# Patient Record
Sex: Female | Born: 1980 | Race: Black or African American | Hispanic: No | Marital: Single | Smoking: Never smoker
Health system: Southern US, Community
[De-identification: ages and names within clinical notes are randomized; demographics above are authoritative.]

## PROBLEM LIST (undated history)

## (undated) DIAGNOSIS — R87619 Unspecified abnormal cytological findings in specimens from cervix uteri: Secondary | ICD-10-CM

## (undated) DIAGNOSIS — I82409 Acute embolism and thrombosis of unspecified deep veins of unspecified lower extremity: Secondary | ICD-10-CM

## (undated) DIAGNOSIS — A64 Unspecified sexually transmitted disease: Secondary | ICD-10-CM

## (undated) HISTORY — DX: Acute embolism and thrombosis of unspecified deep veins of unspecified lower extremity: I82.409

## (undated) HISTORY — PX: TUBAL LIGATION: SHX77

## (undated) HISTORY — DX: Unspecified sexually transmitted disease: A64

## (undated) HISTORY — DX: Unspecified abnormal cytological findings in specimens from cervix uteri: R87.619

---

## 2016-10-14 ENCOUNTER — Emergency Department (HOSPITAL_COMMUNITY): Payer: No Typology Code available for payment source

## 2016-10-14 ENCOUNTER — Emergency Department (HOSPITAL_COMMUNITY)
Admission: EM | Admit: 2016-10-14 | Discharge: 2016-10-14 | Disposition: A | Discharge status: Home / Self Care | Payer: No Typology Code available for payment source | Attending: Emergency Medicine | Admitting: Emergency Medicine

## 2016-10-14 ENCOUNTER — Encounter (HOSPITAL_COMMUNITY): Payer: Self-pay | Admitting: *Deleted

## 2016-10-14 DIAGNOSIS — Y999 Unspecified external cause status: Secondary | ICD-10-CM | POA: Insufficient documentation

## 2016-10-14 DIAGNOSIS — M545 Low back pain: Secondary | ICD-10-CM | POA: Insufficient documentation

## 2016-10-14 DIAGNOSIS — M7918 Myalgia, other site: Secondary | ICD-10-CM

## 2016-10-14 DIAGNOSIS — Y92410 Unspecified street and highway as the place of occurrence of the external cause: Secondary | ICD-10-CM | POA: Diagnosis not present

## 2016-10-14 DIAGNOSIS — Y939 Activity, unspecified: Secondary | ICD-10-CM | POA: Insufficient documentation

## 2016-10-14 LAB — I-STAT BETA HCG BLOOD, ED (MC, WL, AP ONLY)

## 2016-10-14 MED ORDER — METHOCARBAMOL 500 MG PO TABS: 500.0000 mg | ORAL_TABLET | Freq: Two times a day (BID) | ORAL | 0 refills | Status: DC

## 2016-10-14 MED ORDER — NAPROXEN 500 MG PO TABS: 500.0000 mg | ORAL_TABLET | Freq: Two times a day (BID) | ORAL | 0 refills | Status: DC

## 2016-10-14 NOTE — ED Notes (Signed)
Assessment: Pain in R flank area. No radiation, full movement of lower extremity, no blunt trauma
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
## 2016-10-14 NOTE — Discharge Instructions (Signed)
Begin taking naproxen and Robaxin twice daily for pain. Follow up with PCP if symptoms do not improve in 2-3 days. Return to ED for worsening pain, trouble walking, numbness, weakness, trouble breathing or additional injury.

## 2016-10-14 NOTE — ED Notes (Signed)
Patient transported to X-ray 

## 2016-10-14 NOTE — ED Notes (Signed)
Papers reviewed with patient and she verbalizes understanding. Leaving ambulatory
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
## 2017-03-02 ENCOUNTER — Ambulatory Visit (INDEPENDENT_AMBULATORY_CARE_PROVIDER_SITE_OTHER): Payer: Managed Care, Other (non HMO) | Admitting: Obstetrics & Gynecology

## 2017-03-02 ENCOUNTER — Encounter: Payer: Self-pay | Admitting: Obstetrics & Gynecology

## 2017-03-02 ENCOUNTER — Other Ambulatory Visit: Payer: Self-pay | Admitting: Obstetrics & Gynecology

## 2017-03-02 VITALS — BP 102/70 | HR 66 | Resp 12 | Ht 67.0 in | Wt 139.5 lb

## 2017-03-02 DIAGNOSIS — I82409 Acute embolism and thrombosis of unspecified deep veins of unspecified lower extremity: Secondary | ICD-10-CM

## 2017-03-02 DIAGNOSIS — Z0000 Encounter for general adult medical examination without abnormal findings: Secondary | ICD-10-CM

## 2017-03-02 DIAGNOSIS — Z01419 Encounter for gynecological examination (general) (routine) without abnormal findings: Secondary | ICD-10-CM

## 2017-03-02 DIAGNOSIS — Z202 Contact with and (suspected) exposure to infections with a predominantly sexual mode of transmission: Secondary | ICD-10-CM

## 2017-03-02 DIAGNOSIS — O2230 Deep phlebothrombosis in pregnancy, unspecified trimester: Secondary | ICD-10-CM | POA: Diagnosis not present

## 2017-03-02 DIAGNOSIS — Z124 Encounter for screening for malignant neoplasm of cervix: Secondary | ICD-10-CM

## 2017-03-02 DIAGNOSIS — B009 Herpesviral infection, unspecified: Secondary | ICD-10-CM | POA: Insufficient documentation

## 2017-03-02 NOTE — Progress Notes (Signed)
36 y.o. 604P4 Single African American F here for annual exam.  Moved to [place] about two years ago.    Patient reports cycles are very irregular.  She will have bleeding for up to two weeks and then stop for a few days or up to several week.  Bleeding then restarts.  There doesn't seem to be any regularity to this.    Used depo provera in the past.  Had amenorrhea with Depo Provera.  H/O HSV.  Had outbreak at time of delivery with second child.  Has cesarean section for this and then had two scheduled cesarean sections     Patient's last menstrual period was 02/21/2017.          Sexually active: Yes.    The current method of family planning is tubal ligation.    Exercising: No.  The patient does not participate in regular exercise at present. Smoker:  no  Health Maintenance: Pap:  [facility] History of abnormal Pap:  no MMG:  never Colonoscopy:  never BMD:   never TDaP:  Unsure  Pneumonia vaccine(s):  never Zostavax:   never Hep C testing: not indicated Screening Labs: discuss with provider, Hb today: same   reports that she has never smoked. She has never used smokeless tobacco. She reports that she drinks alcohol. She reports that she does not use drugs.  Past Medical History:  Diagnosis Date  . DVT (deep venous thrombosis) (HCC)    in left leg per patient  . STD (sexually transmitted disease)    Genital Herpes    Past Surgical History:  Procedure Laterality Date  . CESAREAN SECTION    . TUBAL LIGATION      Current Outpatient Prescriptions  Medication Sig Dispense Refill  . Multiple Vitamins-Calcium (ONE-A-DAY WOMENS PO) Take 2 tablets by mouth daily.     No current facility-administered medications for this visit.     Family History  Problem Relation Age of Onset  . Colon cancer Mother   . Diabetes Maternal Grandmother     ROS:  Pertinent items are noted in HPI.  Otherwise, a comprehensive ROS was negative.  Exam:   BP 102/70 (BP  Location: Right Arm, Patient Position: Sitting, Cuff Size: Normal)   Pulse 66   Resp 12   Ht 5\' 7"  (1.702 m)   Wt 139 lb 8 oz (63.3 kg)   LMP 02/21/2017   BMI 21.85 kg/m     Height: 5\' 7"  (170.2 cm)  Ht Readings from Last 3 Encounters:  03/02/17 5\' 7"  (1.702 m)  10/14/16 5\' 8"  (1.727 m)    General appearance: alert, cooperative and appears stated age Head: Normocephalic, without obvious abnormality, atraumatic Neck: no adenopathy, supple, symmetrical, trachea midline and thyroid normal to inspection and palpation Lungs: clear to auscultation bilaterally Breasts: normal appearance, no masses or tenderness Heart: regular rate and rhythm Abdomen: soft, non-tender; bowel sounds normal; no masses,  no organomegaly Extremities: extremities normal, atraumatic, no cyanosis or edema Skin: Skin color, texture, turgor normal. No rashes or lesions Lymph nodes: Cervical, supraclavicular, and axillary nodes normal. No abnormal inguinal nodes palpated Neurologic: Grossly normal   Pelvic: External genitalia:  no lesions              Urethra:  normal appearing urethra with no masses, tenderness or lesions              Bartholins and Skenes: normal  Vagina: normal appearing vagina with normal color and discharge, no lesions              Cervix: no lesions              Pap taken: Yes.   Bimanual Exam:  Uterus:  enlarged, 10-12, no distinct fibroids noted weeks size              Adnexa: normal adnexa and no mass, fullness, tenderness               Rectovaginal: Confirms               Anus:  normal sphincter tone, no lesions  Chaperone was present for exam.  A:  Well Woman with normal exam H/O HSV H/O DVT with third pregnancy, was on anticoagulation with third and fourth pregnancy DUB  P:   Mammogram guidelines reviewed.   pap smear and HR HPV obtained today No rx for valtrex needed.  Pt will call if needs RF.   Return for PUS and further assessment of irregular  bleeding CBC, CMP, TSH obtained today Hep B, RPR, HIV, GC/Chl obtained today return annually or prn

## 2017-03-02 NOTE — Progress Notes (Signed)
Scheduled patient while in office for PUS on 03/09/2017 at 3 pm with 3:30 pm consult with Dr.Miller. Patient is agreeable to date and time.

## 2017-03-03 LAB — COMPREHENSIVE METABOLIC PANEL
ALBUMIN: 4.4 g/dL (ref 3.5–5.5)
ALK PHOS: 50 IU/L (ref 39–117)
ALT: 11 IU/L (ref 0–32)
AST: 20 IU/L (ref 0–40)
Albumin/Globulin Ratio: 1.4 (ref 1.2–2.2)
BILIRUBIN TOTAL: 0.3 mg/dL (ref 0.0–1.2)
BUN/Creatinine Ratio: 6 — ABNORMAL LOW (ref 9–23)
BUN: 6 mg/dL (ref 6–20)
CHLORIDE: 103 mmol/L (ref 96–106)
CO2: 22 mmol/L (ref 20–29)
CREATININE: 0.93 mg/dL (ref 0.57–1.00)
Calcium: 9.4 mg/dL (ref 8.7–10.2)
GFR calc Af Amer: 92 mL/min/{1.73_m2} (ref >59)
GFR calc non Af Amer: 80 mL/min/{1.73_m2} (ref >59)
GLUCOSE: 95 mg/dL (ref 65–99)
Globulin, Total: 3.1 g/dL (ref 1.5–4.5)
Potassium: 4.2 mmol/L (ref 3.5–5.2)
Sodium: 139 mmol/L (ref 134–144)
Total Protein: 7.5 g/dL (ref 6.0–8.5)

## 2017-03-03 LAB — HEP, RPR, HIV PANEL
HEP B S AG: NEGATIVE
HIV SCREEN 4TH GENERATION: NONREACTIVE
RPR: NONREACTIVE

## 2017-03-03 LAB — CBC
HEMOGLOBIN: 11 g/dL — AB (ref 11.1–15.9)
Hematocrit: 34.2 % (ref 34.0–46.6)
MCH: 26.6 pg (ref 26.6–33.0)
MCHC: 32.2 g/dL (ref 31.5–35.7)
MCV: 83 fL (ref 79–97)
PLATELETS: 268 10*3/uL (ref 150–379)
RBC: 4.14 x10E6/uL (ref 3.77–5.28)
RDW: 16.3 % — ABNORMAL HIGH (ref 12.3–15.4)
WBC: 4 10*3/uL (ref 3.4–10.8)

## 2017-03-03 LAB — TSH: TSH: 1.22 u[IU]/mL (ref 0.450–4.500)

## 2017-03-08 ENCOUNTER — Other Ambulatory Visit: Payer: Self-pay

## 2017-03-08 DIAGNOSIS — N926 Irregular menstruation, unspecified: Secondary | ICD-10-CM

## 2017-03-08 LAB — PAP LB, CT-NG NAA, HPV HIGH-RISK
Chlamydia, Nuc. Acid Amp: NEGATIVE
Gonococcus, Nuc. Acid Amp: NEGATIVE
HPV, high-risk: POSITIVE — AB
PAP SMEAR COMMENT: 0

## 2017-03-09 ENCOUNTER — Ambulatory Visit (INDEPENDENT_AMBULATORY_CARE_PROVIDER_SITE_OTHER): Payer: Managed Care, Other (non HMO)

## 2017-03-09 ENCOUNTER — Other Ambulatory Visit: Payer: Self-pay | Admitting: Obstetrics & Gynecology

## 2017-03-09 ENCOUNTER — Encounter: Payer: Self-pay | Admitting: Obstetrics & Gynecology

## 2017-03-09 ENCOUNTER — Ambulatory Visit (INDEPENDENT_AMBULATORY_CARE_PROVIDER_SITE_OTHER): Payer: Managed Care, Other (non HMO) | Admitting: Obstetrics & Gynecology

## 2017-03-09 VITALS — BP 110/76 | HR 68 | Resp 14 | Wt 139.0 lb

## 2017-03-09 DIAGNOSIS — D649 Anemia, unspecified: Secondary | ICD-10-CM

## 2017-03-09 DIAGNOSIS — R87811 Vaginal high risk human papillomavirus (HPV) DNA test positive: Secondary | ICD-10-CM

## 2017-03-09 DIAGNOSIS — R87619 Unspecified abnormal cytological findings in specimens from cervix uteri: Secondary | ICD-10-CM | POA: Diagnosis not present

## 2017-03-09 DIAGNOSIS — I82409 Acute embolism and thrombosis of unspecified deep veins of unspecified lower extremity: Secondary | ICD-10-CM

## 2017-03-09 DIAGNOSIS — N926 Irregular menstruation, unspecified: Secondary | ICD-10-CM

## 2017-03-09 DIAGNOSIS — R87620 Atypical squamous cells of undetermined significance on cytologic smear of vagina (ASC-US): Secondary | ICD-10-CM | POA: Diagnosis not present

## 2017-03-09 DIAGNOSIS — O2230 Deep phlebothrombosis in pregnancy, unspecified trimester: Secondary | ICD-10-CM

## 2017-03-09 DIAGNOSIS — N852 Hypertrophy of uterus: Secondary | ICD-10-CM | POA: Diagnosis not present

## 2017-03-09 NOTE — Progress Notes (Signed)
GYNECOLOGY  VISIT   HPI: 36 y.o. G47P4 Single African American female here for evaluation of DUB and enlarged uterus noted on initial exam performed 03/02/17.  Pt has been on Depo Provera in the past and had amenorrhea as a side effects.  She has seen had a BTL but would like to restart the Depo Provera due to amenorrhea side effects.  It has been several years since any gyn exam had been performed until 03/02/17.  At that exam, friability of the cervix was noted.  STD testing and Pap was obtained.  STD testing was negative.  Pap smear showed ASCUS and AGUS pap findings with +HR HPV testing noted.  Findings reviewed with pt.  Evaluation with colposcopy, ECC, endometrial biopsy recommended.  Additional treatment options discussed.  She continues to be interested in Depo Provera.  She would like to proceed with this today if possible.    Ultrasound findings from today: Uterus:  8.6 x 6.6 x 6.2cm.  No fibroids noted but c section scar seen. Endometrium 11.44mm, echogenic fluid noted within cavity Left ovary 2.8 x 1.4 x 1.4cm with 2.0 x 1.3cm left collapsed corpus luteal cyst. Right ovary:  2.9 x 2.0 x 1.8cm Cul de sac:  Moderate free fluid noted in cul de sac and free fluid around left ovary noted  GYNECOLOGIC HISTORY: Patient's last menstrual period was 02/21/2017. Contraception: BTL Menopausal hormone therapy: n/a  Patient Active Problem List   Diagnosis Date Noted  . DVT complicating pregnancy 03/02/2017  . HSV (herpes simplex virus) infection 03/02/2017    Past Medical History:  Diagnosis Date  . DVT (deep venous thrombosis) (HCC)    in left leg per patient  . STD (sexually transmitted disease)    Genital Herpes    Past Surgical History:  Procedure Laterality Date  . CESAREAN SECTION    . TUBAL LIGATION      MEDS:   Current Outpatient Prescriptions on File Prior to Visit  Medication Sig Dispense Refill  . Multiple Vitamins-Calcium (ONE-A-DAY WOMENS PO) Take 2 tablets by mouth  daily.     No current facility-administered medications on file prior to visit.     ALLERGIES: Patient has no known allergies.  Family History  Problem Relation Age of Onset  . Colon cancer Mother 103       stage IV, negative genetic testing  . Diabetes Maternal Grandmother     SH:  Single, non smoker  Review of Systems  Constitutional: Negative.   Respiratory: Negative.   Cardiovascular: Negative.   Gastrointestinal: Negative.   Genitourinary:       Irregular bleeding  Psychiatric/Behavioral: Negative.     PHYSICAL EXAMINATION:    BP 110/76 (BP Location: Left Arm, Patient Position: Sitting, Cuff Size: Normal)   Pulse 68   Resp 14   Wt 139 lb (63 kg)   LMP 02/21/2017   BMI 21.77 kg/m     Physical Exam  Constitutional: She is oriented to person, place, and time. She appears well-developed and well-nourished.  Genitourinary: Vagina normal. There is no rash, tenderness, lesion or injury on the right labia. There is no rash, tenderness, lesion or injury on the left labia.    Lymphadenopathy:       Right: No inguinal adenopathy present.       Left: No inguinal adenopathy present.  Neurological: She is alert and oriented to person, place, and time.  Skin: Skin is warm and dry.  Psychiatric: She has a normal mood and affect.  Colposcopy, ECC, and endometrial biopsy was performed.  Consent obtained.  Speculum placed.  Cervix visualized with colposcopy after application of AWE for >45 seconds.  Ectocervical trasntion zone noted with HPV changes at 5-7 o'clock and AWE at this location as well.  At 10-11 o'clock, there were punctations.  Biopsies obtained at 7 and 11 o'clock.  Then ECC obtained.  Cervix then cleansed with Betadine x 3 and endometrial pipelle passed to 8cm.  Good tissue sample obtained with one pass.  Monsel's applied to biopsy sites for excellent hemostasis.  Pt tolerated procedure well.  Instruments and speculum removed.  Chaperone was present for  exam.  Assessment: DUB Lapse of care Mild anemia H/O DVT so no estrogen products should be used ASCUS and AGUS pap smear with +HR HPV  Plan: Biopsies, ECC, endometrial biopsy results pending.  Results and recommendations will be called to pt.   ~15 minutes spent with patient >50% of time was in face to face discussion of ultrasound findings and options for treatment of DUB discussed before colposcopy with biopsies, ECC and endometrial biopsy.

## 2017-03-13 LAB — PATHOLOGY

## 2017-03-14 ENCOUNTER — Telehealth: Payer: Self-pay | Admitting: Obstetrics & Gynecology

## 2017-03-14 NOTE — Telephone Encounter (Signed)
Dr. Hyacinth MeekerMiller, please review pathology results dated 03/09/17 and advise?

## 2017-03-14 NOTE — Telephone Encounter (Signed)
Patient requesting results from biopsy. °

## 2017-03-15 MED ORDER — MEDROXYPROGESTERONE ACETATE 150 MG/ML IM SUSP: INTRAMUSCULAR | 3 refills | Status: DC

## 2017-03-15 NOTE — Telephone Encounter (Signed)
Spoke with patient, advised as seen below per Dr. Hyacinth MeekerMiller. Rx for depo-provera #1/3RF to verified pharmacy on file. Patient verbalizes understanding and is agreeable.  08 recall placed.  Patient is agreeable to disposition. Will close encounter.

## 2017-03-15 NOTE — Telephone Encounter (Signed)
Notes recorded by Leda MinHamm, Deondrea Markos N, RN on 03/15/2017 at 3:47 PM EDT Left message to call Noreene LarssonJill at [phone]. See telephone encounter dated 03/14/17. ------  Notes recorded by Jerene BearsMiller, Mary S, MD on 03/15/2017 at 3:42 PM EDT Please let pt know that her cervical biopsies showed CIN1, inflammation, and HPV changes. No cancer seen. Her ECC and endometrial biopsy were negative. Needs repeat pap and HR HPV 1 year. 08 recall.  Ok to start Depo Provera 150mg  every 11-13 weeks. Pt has self administered so ok to call into pharmacy for her for one year.

## 2017-03-15 NOTE — Telephone Encounter (Signed)
Left detailed message, ok per dpr. Advised patient message received about results, once results reviewed by Dr. Hyacinth MeekerMiller will return call to review. May return [facility] to [facility] at [phone] with any additional questions.

## 2017-03-15 NOTE — Telephone Encounter (Signed)
Routing results to you to call pt.  Thanks.

## 2017-06-08 NOTE — Addendum Note (Signed)
Addended by: Loreta AveMORALES, REINA C on: 06/08/2017 04:06 PM   Modules accepted: Orders

## 2018-04-17 ENCOUNTER — Telehealth: Payer: Self-pay

## 2018-04-17 NOTE — Telephone Encounter (Signed)
Attempted to contact patient but all numbers listed are not accepting incoming calls.

## 2018-06-08 ENCOUNTER — Other Ambulatory Visit (HOSPITAL_COMMUNITY)
Admission: RE | Admit: 2018-06-08 | Discharge: 2018-06-08 | Disposition: A | Discharge status: Home / Self Care | Payer: Managed Care, Other (non HMO) | Source: Ambulatory Visit | Attending: Obstetrics & Gynecology | Admitting: Obstetrics & Gynecology

## 2018-06-08 ENCOUNTER — Encounter: Payer: Self-pay | Admitting: Obstetrics & Gynecology

## 2018-06-08 ENCOUNTER — Other Ambulatory Visit: Payer: Self-pay

## 2018-06-08 ENCOUNTER — Ambulatory Visit (INDEPENDENT_AMBULATORY_CARE_PROVIDER_SITE_OTHER): Payer: Managed Care, Other (non HMO) | Admitting: Obstetrics & Gynecology

## 2018-06-08 VITALS — BP 110/80 | HR 68 | Resp 16 | Ht 66.75 in | Wt 151.0 lb

## 2018-06-08 DIAGNOSIS — R87619 Unspecified abnormal cytological findings in specimens from cervix uteri: Secondary | ICD-10-CM

## 2018-06-08 DIAGNOSIS — N938 Other specified abnormal uterine and vaginal bleeding: Secondary | ICD-10-CM | POA: Diagnosis not present

## 2018-06-08 DIAGNOSIS — Z124 Encounter for screening for malignant neoplasm of cervix: Secondary | ICD-10-CM | POA: Insufficient documentation

## 2018-06-08 DIAGNOSIS — R87610 Atypical squamous cells of undetermined significance on cytologic smear of cervix (ASC-US): Secondary | ICD-10-CM | POA: Diagnosis not present

## 2018-06-08 DIAGNOSIS — Z01419 Encounter for gynecological examination (general) (routine) without abnormal findings: Secondary | ICD-10-CM | POA: Diagnosis not present

## 2018-06-08 DIAGNOSIS — Z202 Contact with and (suspected) exposure to infections with a predominantly sexual mode of transmission: Secondary | ICD-10-CM

## 2018-06-08 MED ORDER — MEDROXYPROGESTERONE ACETATE 150 MG/ML IM SUSP: INTRAMUSCULAR | 3 refills | Status: DC

## 2018-06-08 MED ORDER — MEDROXYPROGESTERONE ACETATE 150 MG/ML IM SUSP
150.0000 mg | Freq: Once | INTRAMUSCULAR | Status: AC
  Administered 2018-06-08: 150 mg via INTRAMUSCULAR

## 2018-06-08 NOTE — Progress Notes (Signed)
37 y.o. G7P4 Single Black or [place] American female here for annual exam.  Was using Depo Provera this year.  Typically cycles some when on it.  Would like to restart this again.  Does need a paps mere today.  Desires STD testing today.  Patient's last menstrual period was 05/17/2018.          Sexually active: Yes.    The current method of family planning is tubal ligation.    Exercising: No.   Smoker:  no  Health Maintenance: Pap:  03/02/17 AGUS. HR HPV:+detected  History of abnormal Pap:  Yes, CIN 1 MMG:  Never TDaP:  Unsure Screening Labs: last year   reports that she has never smoked. She has never used smokeless tobacco. She reports that she drinks about 2.0 - 3.0 standard drinks of alcohol per week. She reports that she does not use drugs.  Past Medical History:  Diagnosis Date  . DVT (deep venous thrombosis) (HCC)    in left leg per patient  . STD (sexually transmitted disease)    Genital Herpes    Past Surgical History:  Procedure Laterality Date  . CESAREAN SECTION    . TUBAL LIGATION      Current Outpatient Medications  Medication Sig Dispense Refill  . medroxyPROGESTERone (DEPO-PROVERA) 150 MG/ML injection Inject 1 ml (150 mg total) into the muscle once every 11-13 weeks. 1 mL 3  . Multiple Vitamins-Calcium (ONE-A-DAY WOMENS PO) Take 2 tablets by mouth daily.     No current facility-administered medications for this visit.     Family History  Problem Relation Age of Onset  . Colon cancer Mother 43       stage IV, negative genetic testing  . Diabetes Maternal Grandmother     Review of Systems  All other systems reviewed and are negative.   Exam:   BP 110/80 (BP Location: Right Arm, Patient Position: Sitting, Cuff Size: Normal)   Pulse 68   Resp 16   Ht 5' 6.75" (1.695 m)   Wt 151 lb (68.5 kg)   LMP 05/17/2018   BMI 23.83 kg/m    Height: 5' 6.75" (169.5 cm)  Ht Readings from Last 3 Encounters:  06/08/18 5' 6.75" (1.695 m)  03/02/17 5\' 7"  (1.702 m)   10/14/16 5\' 8"  (1.727 m)    General appearance: alert, cooperative and appears stated age Head: Normocephalic, without obvious abnormality, atraumatic Neck: no adenopathy, supple, symmetrical, trachea midline and thyroid normal to inspection and palpation Lungs: clear to auscultation bilaterally Breasts: normal appearance, no masses or tenderness Heart: regular rate and rhythm Abdomen: soft, non-tender; bowel sounds normal; no masses,  no organomegaly Extremities: extremities normal, atraumatic, no cyanosis or edema Skin: Skin color, texture, turgor normal. No rashes or lesions Lymph nodes: Cervical, supraclavicular, and axillary nodes normal. No abnormal inguinal nodes palpated Neurologic: Grossly normal   Pelvic: External genitalia:  no lesions              Urethra:  normal appearing urethra with no masses, tenderness or lesions              Bartholins and Skenes: normal                 Vagina: normal appearing vagina with normal color and discharge, no lesions              Cervix: no lesions              Pap taken: Yes.   Bimanual  Exam:  Uterus:  normal size, contour, position, consistency, mobility, non-tender              Adnexa: normal adnexa and no mass, fullness, tenderness               Rectovaginal: Confirms               Anus:  normal sphincter tone, no lesions  Chaperone was present for exam.  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  Did have evaluation. DUB, on DUB Globular feeling uterus without fibroids on PUS 2018 Desires STD testing today Family hx of colon cancer  P:   Mammogram guidelines reviewed pap smear with HR HPV obtained today GC/Chl, trich, HIV, RPR, Hep B and C given RF for Depo provera 150mg  IM given x 1 today.  Rx to pharmacy.  Pt gives to self.  H/O BTL so timing does not need to be exact but  Depo calendar given to pt. Return annually or prn

## 2018-06-09 LAB — HEP, RPR, HIV PANEL
HEP B S AG: NEGATIVE
HIV Screen 4th Generation wRfx: NONREACTIVE
RPR Ser Ql: NONREACTIVE

## 2018-06-09 LAB — HEPATITIS C ANTIBODY: Hep C Virus Ab: <0.1 s/co ratio (ref 0.0–0.9)

## 2018-06-14 LAB — CYTOLOGY - PAP
CHLAMYDIA, DNA PROBE: NEGATIVE
HPV (WINDOPATH): DETECTED — AB
Neisseria Gonorrhea: NEGATIVE
TRICH (WINDOWPATH): NEGATIVE

## 2018-06-18 ENCOUNTER — Telehealth: Payer: Self-pay | Admitting: *Deleted

## 2018-06-18 DIAGNOSIS — R87619 Unspecified abnormal cytological findings in specimens from cervix uteri: Secondary | ICD-10-CM

## 2018-06-18 NOTE — Telephone Encounter (Signed)
Spoke with patient. Advised as seen below per Dr. Hyacinth MeekerMiller. Patient is familiar with colpo procedure. BTL for contraceptive. LMP 05/17/18. Received depo provera on 06/08/18, no menses to date. Patient requesting to schedule colpo in 07/2018. Advised I will review schedule with nursing supervisor and return call, patient agreeable.   Order placed for colposcopy.

## 2018-06-18 NOTE — Telephone Encounter (Signed)
Spoke with patient. Colpo scheduled for 07/19/18 at 9:45am with Dr. Hyacinth MeekerMiller. Advised to take Motrin 800 mg with food and water one hour before procedure. Advised patient order placed for precert, our business office will return call prior to appt to review benefits. Patient verbalizes understanding and is agreeable.  Routing to provider for final review. Patient is agreeable to disposition. Will close encounter.  Cc: Harland DingwallSuzy Dixon

## 2018-06-18 NOTE — Telephone Encounter (Signed)
Notes recorded by Leda MinHamm, Garritt Molyneux N, RN on 06/18/2018 at 2:45 PM EST Left message to call [facility]Jill, [facility] [phone].   Notes recorded by Jerene BearsMiller, Mary S, MD on 06/11/2018 at 12:17 AM EST Please let pt know her HIV, RPR, Hep B and C testing is negative. Pap, GC/Chl pending. Ok to hold for these results.

## 2018-06-18 NOTE — Telephone Encounter (Signed)
-----   Message from Jerene BearsMary S Miller, MD sent at 06/15/2018  8:11 AM EST ----- Please let pt know her pap was abnormal.  She does need a colposcopy with possible biopsies and ECC.  Thanks.

## 2018-07-05 ENCOUNTER — Telehealth: Payer: Self-pay | Admitting: Obstetrics & Gynecology

## 2018-07-05 NOTE — Telephone Encounter (Signed)
Patient is returning a call to Rosa. °

## 2018-07-05 NOTE — Telephone Encounter (Signed)
Call placed to convey benefits. 

## 2018-07-05 NOTE — Telephone Encounter (Signed)
Call placed to convey benefits for colpo/ecc.

## 2018-07-19 ENCOUNTER — Ambulatory Visit: Payer: Self-pay | Admitting: Obstetrics & Gynecology

## 2018-07-19 ENCOUNTER — Encounter: Payer: Self-pay | Admitting: Obstetrics & Gynecology

## 2018-07-19 ENCOUNTER — Telehealth: Payer: Self-pay | Admitting: Obstetrics & Gynecology

## 2018-07-19 NOTE — Telephone Encounter (Signed)
Patient called and stated that she was just waking up and would be over 30 minutes late to her appointment. Patient needs to reschedule Colpo & ECC.  Dr. Hyacinth Meeker- please advise on [facility] policy.

## 2018-07-19 NOTE — Telephone Encounter (Signed)
Left message to call Brittney Larsson, RN at [facility] [phone].    BTL for contraceptive Received Depo provera on 06/08/18

## 2018-07-19 NOTE — Telephone Encounter (Signed)
[facility] in [facility] situation.  Thanks.

## 2018-07-19 NOTE — Telephone Encounter (Signed)
Spoke with patient. Has light cycle since starting depo provera, LMP 07/14/18. Colpo rescheduled to 08/07/18 at 1:30pm with Dr. Hyacinth Meeker. Advised to take Motrin 800 mg with food and water one hour before procedure.  Routing to provider for final review. Patient is agreeable to disposition. Will close encounter.

## 2018-07-23 ENCOUNTER — Encounter: Payer: Self-pay | Admitting: Obstetrics & Gynecology

## 2018-08-01 ENCOUNTER — Emergency Department (HOSPITAL_BASED_OUTPATIENT_CLINIC_OR_DEPARTMENT_OTHER)
Admission: EM | Admit: 2018-08-01 | Discharge: 2018-08-02 | Disposition: A | Discharge status: Home / Self Care | Payer: Managed Care, Other (non HMO) | Attending: Emergency Medicine | Admitting: Emergency Medicine

## 2018-08-01 ENCOUNTER — Emergency Department (HOSPITAL_BASED_OUTPATIENT_CLINIC_OR_DEPARTMENT_OTHER): Payer: Managed Care, Other (non HMO)

## 2018-08-01 ENCOUNTER — Other Ambulatory Visit: Payer: Self-pay

## 2018-08-01 ENCOUNTER — Encounter (HOSPITAL_BASED_OUTPATIENT_CLINIC_OR_DEPARTMENT_OTHER): Payer: Self-pay

## 2018-08-01 DIAGNOSIS — R0981 Nasal congestion: Secondary | ICD-10-CM | POA: Diagnosis present

## 2018-08-01 DIAGNOSIS — Z79899 Other long term (current) drug therapy: Secondary | ICD-10-CM | POA: Diagnosis not present

## 2018-08-01 DIAGNOSIS — R0789 Other chest pain: Secondary | ICD-10-CM | POA: Insufficient documentation

## 2018-08-01 DIAGNOSIS — R69 Illness, unspecified: Secondary | ICD-10-CM

## 2018-08-01 DIAGNOSIS — R059 Cough, unspecified: Secondary | ICD-10-CM

## 2018-08-01 DIAGNOSIS — R05 Cough: Secondary | ICD-10-CM

## 2018-08-01 DIAGNOSIS — J111 Influenza due to unidentified influenza virus with other respiratory manifestations: Secondary | ICD-10-CM | POA: Insufficient documentation

## 2018-08-01 NOTE — ED Triage Notes (Addendum)
C/o flu like sx with DOE x 4 days-NAD-steady gait

## 2018-08-01 NOTE — ED Provider Notes (Signed)
[facility] EMERGENCY DEPARTMENT Provider Note  CSN: [id number] Arrival date & time: 08/01/18 2226  Chief Complaint(s) Cough  HPI Brittney Franco is a 38 y.o. 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  She presents today with 3 to 4 days of nasal congestion, rhinorrhea, postnasal drip, sore throat, cough, myalgias that gradually worsened since onset.  Patient also endorsed posttussive chest pain.  Denies any current pain at this time.  Also endorsed mild shortness of breath while climbing over these past couple of days.  She reports that she has been having chills and subjective fevers.  Endorses sick contacts at work with flulike symptoms.  Endorses nausea without emesis.  No abdominal pain or diarrhea.  HPI  Past Medical History Past Medical History:  Diagnosis Date  . DVT (deep venous thrombosis) (HCC)    in left leg per patient  . STD (sexually transmitted disease)    Genital Herpes   Patient Active Problem List   Diagnosis Date Noted  . DUB (dysfunctional uterine bleeding) 06/08/2018  . DVT complicating pregnancy 03/02/2017  . HSV (herpes simplex virus) infection 03/02/2017   Home Medication(s) Prior to Admission medications   Medication Sig Start Date End Date Taking? Authorizing Provider  medroxyPROGESTERone (DEPO-PROVERA) 150 MG/ML injection Inject 1 ml (150 mg total) into the muscle once every 11-13 weeks. 06/08/18   Jerene Bears, MD  Multiple Vitamins-Calcium (ONE-A-DAY WOMENS PO) Take 2 tablets by mouth daily.    [provider]                                                                                                                                    Past Surgical History Past Surgical History:  Procedure Laterality Date  . CESAREAN SECTION    . TUBAL LIGATION     Family History Family History  Problem Relation Age of Onset  . Colon cancer Mother 27       stage IV,  negative genetic testing  . Diabetes Maternal Grandmother     Social History Social History   Tobacco Use  . Smoking status: Never Smoker  . Smokeless tobacco: Never Used  Substance Use Topics  . Alcohol use: Yes    Comment: occ  . Drug use: No   Allergies Patient has no known allergies.  Review of Systems Review of Systems All other systems are reviewed and are negative for acute change except as noted in the HPI  Physical Exam Vital Signs  I have reviewed the triage vital signs BP 121/72 (BP Location: Left Arm)   Pulse 84   Temp 98.7 F (37.1 C) (Oral)   Resp 18   Ht 5\' 7"  (1.702 m)   Wt 68.5 kg   LMP 06/13/2018   SpO2 100%   BMI 23.65 kg/m   Physical Exam Vitals signs reviewed.  Constitutional:  General: She is not in acute distress.    Appearance: She is well-developed. She is not diaphoretic.  HENT:     Head: Normocephalic and atraumatic.     Right Ear: Tympanic membrane, ear canal and external ear normal. No mastoid tenderness.     Left Ear: Ear canal and external ear normal. A middle ear effusion is present. No mastoid tenderness.     Nose: Mucosal edema and rhinorrhea present.     Mouth/Throat:     Mouth: Mucous membranes are moist.     Dentition: No gum lesions.     Tongue: No lesions.     Palate: No lesions.     Pharynx: Posterior oropharyngeal erythema (mild with postnasal drip.) present. No pharyngeal swelling, oropharyngeal exudate or uvula swelling.     Tonsils: Swelling: 0 on the right. 0 on the left.  Eyes:     General: No scleral icterus.       Right eye: No discharge.        Left eye: No discharge.     Conjunctiva/sclera: Conjunctivae normal.     Pupils: Pupils are equal, round, and reactive to light.  Neck:     Musculoskeletal: Normal range of motion and neck supple.  Cardiovascular:     Rate and Rhythm: Normal rate and regular rhythm.     Heart sounds: No murmur. No friction rub. No gallop.   Pulmonary:     Effort: Pulmonary  effort is normal. No respiratory distress.     Breath sounds: Normal breath sounds. No stridor. No rales.  Abdominal:     General: There is no distension.     Palpations: Abdomen is soft.     Tenderness: There is no abdominal tenderness.  Musculoskeletal:        General: No tenderness.     Comments: No extremity swelling  Skin:    General: Skin is warm and dry.     Findings: No erythema or rash.  Neurological:     Mental Status: She is alert and oriented to person, place, and time.     ED Results and Treatments Labs (all labs ordered are listed, but only abnormal results are displayed) Labs Reviewed - No data to display                                                                                                                       EKG  EKG Interpretation  Date/Time:  Wednesday August 01 2018 23:55:08 EST Ventricular Rate:  66 PR Interval:    QRS Duration: 79 QT Interval:  411 QTC Calculation: 431 R Axis:   64 Text Interpretation:  Sinus rhythm no stemi No old tracing to compare Confirmed by Drema Pryardama, Pedro [phone]([id number]) on 08/02/2018 12:12:44 AM      Radiology Dg Chest 2 View  Result Date: 08/01/2018 CLINICAL DATA:  Chest pain, cough, shortness of breath EXAM: CHEST - 2 VIEW COMPARISON:  None. FINDINGS: Heart and mediastinal contours are within normal limits. No focal  opacities or effusions. No acute bony abnormality. IMPRESSION: No active cardiopulmonary disease. Electronically Signed   By: Charlett NoseKevin  Dover M.D.   On: 08/01/2018 23:54   Pertinent labs & imaging results that were available during my care of the patient were reviewed by me and considered in my medical decision making (see chart for details).  Medications Ordered in ED Medications - No data to display                                                                                                                                  Procedures Procedures  (including critical care time)  Medical Decision Making /  ED Course I have reviewed the nursing notes for this encounter and the patient's prior records (if available in EHR or on provided paperwork).    38 y.o. female presents with flu-like symptoms for 4 days.  adequate oral hydration. Rest of history as above.  Patient appears well. No signs of toxicity, patient is interactive. No hypoxia, tachypnea or other signs of respiratory distress. No sign of clinical dehydration. Lung exam clear. Rest of exam as above.  Patient has a history of DVT per her indolent process is not suspicious for pulmonary embolism.  Most consistent with flu-like illness.  Chest x-ray without evidence of pneumonia.  No evidence suggestive of pharyngitis, AOM, PNA.   Discussed symptomatic treatment with the patient and they will follow closely with their PCP.    Final Clinical Impression(s) / ED Diagnoses Final diagnoses:  Cough  Influenza-like illness   Disposition: Discharge  Condition: Good  I have discussed the results, Dx and Tx plan with the patient who expressed understanding and agree(s) with the plan. Discharge instructions discussed at great length. The patient was given strict return precautions who verbalized understanding of the instructions. No further questions at time of discharge.    ED Discharge Orders    None       Follow Up: Primary care provider  Schedule an appointment as soon as possible for a visit  As needed, If symptoms do not improve or  worsen in 5-7 days      This chart was dictated using voice recognition software.  Despite best efforts to proofread,  errors can occur which can change the documentation meaning.   Nira Connardama, Pedro Eduardo, MD 08/02/18 [phone]

## 2018-08-02 NOTE — Discharge Instructions (Addendum)
You may take over-the-counter medicine for symptomatic relief, such as Tylenol, Motrin, TheraFlu, Alka seltzer , black elderberry, etc. Please limit acetaminophen (Tylenol) to 4000 mg and Ibuprofen (Motrin, Advil, etc.) to 2400 mg for a 24hr period. Please note that other over-the-counter medicine may contain acetaminophen or ibuprofen as a component of their ingredients.   

## 2018-08-07 ENCOUNTER — Ambulatory Visit (INDEPENDENT_AMBULATORY_CARE_PROVIDER_SITE_OTHER): Payer: Managed Care, Other (non HMO) | Admitting: Obstetrics & Gynecology

## 2018-08-07 ENCOUNTER — Encounter: Payer: Self-pay | Admitting: Obstetrics & Gynecology

## 2018-08-07 VITALS — BP 130/70 | HR 66 | Resp 16 | Ht 66.0 in | Wt 146.6 lb

## 2018-08-07 DIAGNOSIS — R87610 Atypical squamous cells of undetermined significance on cytologic smear of cervix (ASC-US): Secondary | ICD-10-CM

## 2018-08-07 DIAGNOSIS — R87810 Cervical high risk human papillomavirus (HPV) DNA test positive: Secondary | ICD-10-CM

## 2018-08-07 DIAGNOSIS — R87619 Unspecified abnormal cytological findings in specimens from cervix uteri: Secondary | ICD-10-CM

## 2018-08-07 NOTE — Progress Notes (Signed)
38 y.o. Single African American female here for colposcopy with possible biopsies and/or ECC due to atypical endocervical cells with +HR HPV Pap obtained 06/08/2018.    Patient's last menstrual period was 06/16/2018 (exact date).          Sexually active: Yes.    The current method of family planning is tubal ligation.     Patient has been counseled about results and procedure.  Risks and benefits have bene reviewed including immediate and/or delayed bleeding, infection, cervical scaring from procedure, possibility of needing additional follow up as well as treatment.  rare risks of missing a lesion discussed as well.  All questions answered.  Pt ready to proceed.  BP 130/70   Pulse 66   Resp 16   Ht 5\' 6"  (1.676 m)   Wt 146 lb 9.6 oz (66.5 kg)   LMP 06/16/2018 (Exact Date)   BMI 23.66 kg/m   Physical Exam  Speculum placed.  3% acetic acid applied to cervix for >45 seconds.  Cervix visualized with both 7.5X and 15X magnification.  Green filter also used.  Lugols solution was used.  Findings:  AWE and decreased staining at 6 - 9 o'clock.  Vessel noted at 6 o'clock position as well.  Biopsy:  Both 6 and 9 o'clock positions.  ECC:  was performed.  Monsel's was needed. There was significant bleeding from the 6 o'clock position.  Longer time than normal needed to obtained excellent hemostasis.  But excellent hemostasis was achieved with Monsel's and pressure.  Pt tolerated procedure well and all instruments were removed.  Findings noted above on picture of cervix.  Assessment:  Atypical endocervical cells with +HR HPV  Plan:  Pathology results will be called to patient and follow-up planned pending results.

## 2018-08-16 ENCOUNTER — Telehealth: Payer: Self-pay | Admitting: Emergency Medicine

## 2018-08-16 DIAGNOSIS — R87619 Unspecified abnormal cytological findings in specimens from cervix uteri: Secondary | ICD-10-CM

## 2018-08-16 NOTE — Telephone Encounter (Signed)
-----   Message from Jerene Bears, MD sent at 08/15/2018  2:22 PM EST ----- Please let pt know that her biopsies showed some mild atypic but no actual dypslasia.  The ECC was negative as well.  As this was all negative, she does need an endometrial biopsy.  Please schedule appt.

## 2018-08-16 NOTE — Telephone Encounter (Signed)
Message left to return call to Tibbie at [phone].   Needs Endometrial biopsy order.  Hx BTL.

## 2018-08-16 NOTE — Telephone Encounter (Signed)
Patient returned call. Results reviewed with patient and she verbalized understanding. Patient asking cost of EMB prior to scheduling. RN advised would send message to [place] [employer] [place] in [employer] Department to reach out to patient to review benefits prior to scheduling. Patient agreeable.   Routing to [place] to assist patient.

## 2018-08-21 NOTE — Telephone Encounter (Signed)
Call placed to patient to review benefits for recommended endometrial biopsy. Left voicemail message requesting a return call    cc: Soundra Pilon

## 2018-08-29 NOTE — Telephone Encounter (Signed)
Call placed to patient to review benefits for recommended endometrial biopsy. Left voicemail message requesting a return call

## 2018-11-16 NOTE — Telephone Encounter (Signed)
Routing to [employer] for follow up with patient.

## 2018-11-19 ENCOUNTER — Telehealth: Payer: Self-pay | Admitting: Obstetrics & Gynecology

## 2018-11-19 NOTE — Telephone Encounter (Signed)
See previous phone notes. Call placed to patient to follow up and schedule endometrial biopsy. Left voicemail message requesting a return call.   cc: Nolen Mu, RN

## 2018-11-19 NOTE — Telephone Encounter (Signed)
Spoke with patient to schedule EMB. Patient states that she is not going to proceed with EMB at this time. Declines to schedule. Does not want to discuss benefits or scheduling at this time. Advised will notify Dr.Miller.

## 2018-12-31 ENCOUNTER — Encounter: Payer: Self-pay | Admitting: Obstetrics & Gynecology

## 2018-12-31 NOTE — Telephone Encounter (Signed)
Letter mailed to patient.

## 2018-12-31 NOTE — Telephone Encounter (Signed)
Letter written to be mailed to pt.  Pt is in a recall for 08/2019 Pap smear.

## 2019-06-24 ENCOUNTER — Telehealth: Payer: Self-pay | Admitting: Obstetrics & Gynecology

## 2019-06-24 DIAGNOSIS — R87619 Unspecified abnormal cytological findings in specimens from cervix uteri: Secondary | ICD-10-CM

## 2019-06-24 NOTE — Telephone Encounter (Signed)
Patient is calling to schedule endometrial biopsy. Updated insurance information sent to [facility] via staff message.

## 2019-06-24 NOTE — Telephone Encounter (Signed)
Left message to call back for EMB procedure.

## 2019-06-25 ENCOUNTER — Telehealth: Payer: Self-pay | Admitting: Obstetrics & Gynecology

## 2019-06-25 NOTE — Telephone Encounter (Signed)
Call placed to patient to review benefit for scheduled endometrial biopsy. Patient advised she is unable to talk. Patient  States she will call back.    copied to [facility]

## 2019-06-25 NOTE — Telephone Encounter (Signed)
Spoke to pt. Pt wanting to set up recommended EMB from 08/2018. Pt scheduled for AEX on 07/16/2019. Pt wanting to wait on EMB until late Jan 2021 for start of new insurance. Marland Kitchen EMB scheduled for 07/29/2019 at 3:30pm per pts request. Pt agreeable and verbalized understanding. Pt instructed to take Motrin 800 mg with food and water one hour before procedure.  Will route to Dr Sabra Heck for final review and will close encounter.   Weston Brass for precert. Pt aware of call for benefits. Orders placed.

## 2019-07-15 ENCOUNTER — Other Ambulatory Visit: Payer: Self-pay | Admitting: Obstetrics & Gynecology

## 2019-07-15 DIAGNOSIS — Z01419 Encounter for gynecological examination (general) (routine) without abnormal findings: Secondary | ICD-10-CM

## 2019-07-15 NOTE — Telephone Encounter (Signed)
Spoke to pt. Pt states needing to reschedule EMB and AEX due to menses and new job. Pt states will not have new job benefits until 08/09/2019. Reviewed OV notes and pt needs AEX after 08/08/2019 from last colpo. Pt scheduled for AEX 08/16/2019 at 4pm. Pt verbalized understanding and agreeable. Pt rescheduled EMB per SM and letter from 12/31/2018 to 08/19/2019. Pt agreeable.   Pt also needs refill of Depo Provera for self injections today. Due before 07-25-2019 and has started cycle now. Orders pended for Depo Provera- 150mg  x1, 0RF.   Will route to Dr for review and refill.

## 2019-07-15 NOTE — Telephone Encounter (Signed)
Left message for pt to call back to triage RN.  

## 2019-07-15 NOTE — Telephone Encounter (Signed)
1. Patient is calling regarding annual exam that is scheduled for tomorrow (07/16/19). Patient stated that she is currently on her menses and is requesting to move her annual exam to 08/09/19.   2. Patient is requesting to cancel EMB.   3. Patient is requesting a refill on her depo.

## 2019-07-16 ENCOUNTER — Other Ambulatory Visit: Payer: Self-pay | Admitting: Obstetrics & Gynecology

## 2019-07-16 ENCOUNTER — Ambulatory Visit: Payer: Managed Care, Other (non HMO) | Admitting: Obstetrics & Gynecology

## 2019-07-17 MED ORDER — MEDROXYPROGESTERONE ACETATE 150 MG/ML IM SUSP: 150.0000 mg | INTRAMUSCULAR | 0 refills | Status: DC

## 2019-07-29 ENCOUNTER — Ambulatory Visit: Payer: Self-pay | Admitting: Obstetrics & Gynecology

## 2019-08-12 ENCOUNTER — Ambulatory Visit: Payer: Managed Care, Other (non HMO) | Admitting: Obstetrics & Gynecology

## 2019-08-16 ENCOUNTER — Ambulatory Visit: Payer: Managed Care, Other (non HMO) | Admitting: Obstetrics & Gynecology

## 2019-08-19 ENCOUNTER — Ambulatory Visit: Payer: Self-pay | Admitting: Obstetrics & Gynecology

## 2019-09-18 ENCOUNTER — Other Ambulatory Visit: Payer: Self-pay | Admitting: *Deleted

## 2019-09-18 DIAGNOSIS — Z01419 Encounter for gynecological examination (general) (routine) without abnormal findings: Secondary | ICD-10-CM

## 2019-09-18 NOTE — Telephone Encounter (Signed)
Spoke to pt. Pt states having new job [employer]ch[employer], [employer] and has to use PTO, so rescheduled AEX with Dr Hyacinth Meeker to 10/07/2019 at 4 pm. Pt agreeable to date and time of appt. Pt also needing Depo provera Rx sent to pharmacy on file. Depo due on 10/03/2019. Pt administers to self.   Routing to Dr Hyacinth Meeker for review and Rx refill. Pended if approved.

## 2019-09-18 NOTE — Telephone Encounter (Signed)
Patient canceled annual exam due to inability to get off work.  Needs refill on Depo called in ( states she administers her own depo). Last office visit 06-2018. Has canceled multiple appointments due to schedule conflicts. Advised will have nurse call back regarding refill.  [facility]

## 2019-09-18 NOTE — Telephone Encounter (Signed)
Last annual 06-2018. Last in office for colpo 08-2018. Atypia on biopsy, no dysplasia. 08 recall 08-2019.  Endo biopsy ordered 08-2018. Patient declined to schedule.  Patient on depo for DUB due to fibroids.  Has had BTSP for contraception.

## 2019-09-19 MED ORDER — MEDROXYPROGESTERONE ACETATE 150 MG/ML IM SUSP: 150.0000 mg | INTRAMUSCULAR | 0 refills | Status: DC

## 2019-09-27 ENCOUNTER — Ambulatory Visit: Payer: Managed Care, Other (non HMO) | Admitting: Obstetrics and Gynecology

## 2019-10-02 NOTE — Progress Notes (Signed)
38 y.o. G63P4 Single Black or [place] American female here for annual exam.  Doing well.  Does not have menstrual bleeding.  Having some carpel tunnel symptoms.  Has appt with hand surgeon 10/23/2019.    She has regular menstrual cycles.  She is on Depo Provera.    LMP:  09/19/2019 Sexually active: Yes.    The current method of family planning is tubal ligation & depo.    Exercising: Yes.    walking Smoker:  no  Health Maintenance: Pap:  03-02-17 ASCUS/AGUS HPV HR+, 06-08-18 ASCUS HPV HR+ Colposcopy 08/07/2018 showed mild atypia in biopsies but no actual dysplasia.  ECC was negative.  Endometrial biopsy was recommended but pt declined and did not return for this. History of abnormal Pap:  Yes CIN1 MMG:  none Colonoscopy:  none BMD:   none TDaP:  unsure Hep C testing: neg 2019 Screening Labs: not obtained today   reports that she has never smoked. She has never used smokeless tobacco. She reports current alcohol use. She reports that she does not use drugs.  Past Medical History:  Diagnosis Date  . DVT (deep venous thrombosis) (HCC)    in left leg per patient  . STD (sexually transmitted disease)    Genital Herpes    Past Surgical History:  Procedure Laterality Date  . CESAREAN SECTION    . TUBAL LIGATION      Current Outpatient Medications  Medication Sig Dispense Refill  . medroxyPROGESTERone (DEPO-PROVERA) 150 MG/ML injection Inject 1 mL (150 mg total) into the muscle every 3 (three) months. 1 mL 0  . Multiple Vitamins-Calcium (ONE-A-DAY WOMENS PO) Take 2 tablets by mouth daily.     No current facility-administered medications for this visit.    Family History  Problem Relation Age of Onset  . Colon cancer Mother 45       stage IV, negative genetic testing  . Diabetes Maternal Grandmother     Review of Systems  Constitutional: Negative.   HENT: Negative.   Eyes: Negative.   Respiratory: Negative.   Cardiovascular: Negative.   Gastrointestinal: Negative.    Endocrine: Negative.   Genitourinary: Negative.   Musculoskeletal: Negative.   Skin: Negative.   Allergic/Immunologic: Negative.   Neurological: Negative.   Psychiatric/Behavioral: Negative.     Exam:   Vitals:   10/07/19 1609  BP: 118/74  Pulse: 68  Resp: 16  Temp: 97.7 F (36.5 C)    General appearance: alert, cooperative and appears stated age Head: Normocephalic, without obvious abnormality, atraumatic Neck: no adenopathy, supple, symmetrical, trachea midline and thyroid normal to inspection and palpation Lungs: clear to auscultation bilaterally Breasts: normal appearance, no masses or tenderness Heart: regular rate and rhythm Abdomen: soft, non-tender; bowel sounds normal; no masses,  no organomegaly Extremities: extremities normal, atraumatic, no cyanosis or edema Skin: Skin color, texture, turgor normal. No rashes or lesions Lymph nodes: Cervical, supraclavicular, and axillary nodes normal. No abnormal inguinal nodes palpated Neurologic: Grossly normal   Pelvic: External genitalia:  no lesions              Urethra:  normal appearing urethra with no masses, tenderness or lesions              Bartholins and Skenes: normal                 Vagina: normal appearing vagina with normal color and discharge, no lesions              Cervix: no lesions  Pap taken: Yes.   Bimanual Exam:  Uterus:  normal size, contour, position, consistency, mobility, non-tender              Adnexa: normal adnexa and no mass, fullness, tenderness               Rectovaginal: Confirms               Anus:  normal sphincter tone, no lesions  Chaperone, Zenovia Jordan, CMA, was present for exam.  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  Evaluation was negative. On Depo Provera for contraception Family hx of  colon cancer H/o HSV  P:   Mammogram guidelines reviewed pap smear with HR HPV obtained today RF for Depo Provera 150mg  IM q 12 weeks Declines STD testing Considering hematology consultation due to hx of DVT Return annually or prn

## 2019-10-07 ENCOUNTER — Ambulatory Visit: Payer: Managed Care, Other (non HMO) | Admitting: Obstetrics & Gynecology

## 2019-10-07 ENCOUNTER — Encounter: Payer: Self-pay | Admitting: Obstetrics & Gynecology

## 2019-10-07 ENCOUNTER — Other Ambulatory Visit: Payer: Self-pay

## 2019-10-07 ENCOUNTER — Other Ambulatory Visit (HOSPITAL_COMMUNITY)
Admission: RE | Admit: 2019-10-07 | Discharge: 2019-10-07 | Disposition: A | Discharge status: Home / Self Care | Payer: Managed Care, Other (non HMO) | Source: Ambulatory Visit | Attending: Obstetrics & Gynecology | Admitting: Obstetrics & Gynecology

## 2019-10-07 VITALS — BP 118/74 | HR 68 | Temp 97.7°F | Resp 16 | Ht 67.75 in | Wt 152.0 lb

## 2019-10-07 DIAGNOSIS — R87610 Atypical squamous cells of undetermined significance on cytologic smear of cervix (ASC-US): Secondary | ICD-10-CM | POA: Insufficient documentation

## 2019-10-07 DIAGNOSIS — R87810 Cervical high risk human papillomavirus (HPV) DNA test positive: Secondary | ICD-10-CM | POA: Diagnosis present

## 2019-10-07 DIAGNOSIS — R87619 Unspecified abnormal cytological findings in specimens from cervix uteri: Secondary | ICD-10-CM

## 2019-10-07 DIAGNOSIS — Z01419 Encounter for gynecological examination (general) (routine) without abnormal findings: Secondary | ICD-10-CM | POA: Diagnosis not present

## 2019-10-07 MED ORDER — MEDROXYPROGESTERONE ACETATE 150 MG/ML IM SUSP: 150.0000 mg | INTRAMUSCULAR | 4 refills | Status: DC

## 2019-10-07 NOTE — Patient Instructions (Signed)
Dr. Arlan Organ, [facility], hematology/oncology

## 2019-10-11 LAB — CYTOLOGY - PAP
Comment: NEGATIVE
Comment: NEGATIVE
Comment: NEGATIVE
Diagnosis: HIGH — AB
HPV 16: NEGATIVE
HPV 18 / 45: NEGATIVE
High risk HPV: POSITIVE — AB

## 2019-10-15 ENCOUNTER — Telehealth: Payer: Self-pay | Admitting: *Deleted

## 2019-10-15 DIAGNOSIS — R87611 Atypical squamous cells cannot exclude high grade squamous intraepithelial lesion on cytologic smear of cervix (ASC-H): Secondary | ICD-10-CM

## 2019-10-15 DIAGNOSIS — R87810 Cervical high risk human papillomavirus (HPV) DNA test positive: Secondary | ICD-10-CM

## 2019-10-15 MED ORDER — METRONIDAZOLE 0.75 % VA GEL: 1.0000 | Freq: Every day | VAGINAL | 0 refills | Status: AC

## 2019-10-15 NOTE — Telephone Encounter (Signed)
Spoke with patient, advised of all results per Dr. Hyacinth Meeker.   Rx for metrogel to verified pharmacy.   LMP 09/26/19. Tubal ligation and Depo-provera for contraceptive.   Patient is familiar with colpo procedure. Patient declines to schedule at this time. Patient states she was unable to work for several weeks after last colpo due to discomfort. Patient states she will return call "1st part of May 2021" to schedule. Offered to schedule first week of May, patient declined.  Order placed for colpo for precert. 1 mo recall placed.   Routing to provider for final review. Patient is agreeable to disposition. Will close encounter.  Cc: Soundra Pilon, [address] [place]

## 2019-10-15 NOTE — Telephone Encounter (Signed)
Leda Min, RN  10/15/2019 9:14 AM EDT    Left message to call Noreene Larsson, RN at [facility] [phone].

## 2019-10-15 NOTE — Telephone Encounter (Signed)
-----   Message from Jerene Bears, MD sent at 10/13/2019  6:37 AM EDT ----- Please let pt know her pap showed ASCUS, cannot exclude HGSIL cells.  HR HPV was still positive.  Needs colposcopy.  Please inform pt that her Pap smear also showed BV.  Ok to treat with Metrogel 0.75%, one applicator QHS x 5 nights OR tindamax 1 gram po daily x 5 days.  If pt chooses oral medication, please advise no ETOH while on medication.    Also, I did check with hematology and she does not need to be on anticoagulation at this time unless has another pregnancy or has another clot.  They did suggest we try and get records from her hematologist in [place] to have on file for future reference.  Could you ask her to please call and request records be sent to me?  Thanks.  Cc:  Cornelia Copa, CMA

## 2019-11-05 ENCOUNTER — Telehealth: Payer: Self-pay | Admitting: *Deleted

## 2019-11-05 NOTE — Telephone Encounter (Signed)
Leda Min, RN  11/05/2019 8:54 AM EDT    Left message to call Noreene Larsson, RN at [facility] [phone].     Colpo not scheduled to date.  See 10/07/19 pap results: Pap ASC-H, positive HPV Tubal ligation for contraceptive, also on depo-provera

## 2019-11-06 NOTE — Telephone Encounter (Signed)
Call placed to convey benefits for colposcopy an schedule appointment.

## 2019-11-20 NOTE — Telephone Encounter (Addendum)
Colpo not scheduled to date. Patient has not returned call x2.   Routing to Dr. Hyacinth Meeker to advise.

## 2019-11-22 NOTE — Telephone Encounter (Signed)
As this is a high grade pap smear, I think we need the 30 day letter written.  Could you do this please, Kaitlyn.  Routing to [employer], Charity fundraiser, so she is aware as she has tried to call pt multiple times.

## 2019-11-22 NOTE — Telephone Encounter (Signed)
Letter out to patient's home address on file. Removed from hold.Encounter closed.

## 2019-11-22 NOTE — Telephone Encounter (Signed)
30 day letter written and to Dr.Miller for review.

## 2019-12-13 ENCOUNTER — Other Ambulatory Visit: Payer: Self-pay | Admitting: Obstetrics & Gynecology

## 2019-12-13 ENCOUNTER — Telehealth: Payer: Self-pay | Admitting: Obstetrics & Gynecology

## 2019-12-13 DIAGNOSIS — Z01419 Encounter for gynecological examination (general) (routine) without abnormal findings: Secondary | ICD-10-CM

## 2019-12-13 NOTE — Telephone Encounter (Signed)
Left message for pt to return call to triage RN. 

## 2019-12-13 NOTE — Telephone Encounter (Signed)
Spoke with pt. Pt states needing Depo Rx sent to pharmacy. She states she called and no Rx.  [facility], states had not received the Rx from 10/07/19 by Dr Hyacinth Meeker. Gave verbal orders for Rx. Pt to be called when ready.   medroxyPROGESTERone (DEPO-PROVERA) 150 MG/ML injection 1 mL 4 10/07/2019    Sig - Route: Inject 1 mL (150 mg total) into the muscle every 3 (three) months. - Intramuscular   Sent to pharmacy as: medroxyPROGESTERone (DEPO-PROVERA) 150 MG/ML injection   E-Prescribing Status: Receipt confirmed by pharmacy (10/07/2019 5:46 PM EDT)     Pt also states needing to reschedule her colpo due to getting letter from office. Pt has hx of  ASCUS and HR HPV on past pap in 10/2019.   Pt scheduled for Colpo on 12/19/19 at 8 am with Dr Hyacinth Meeker. Pt agreeable and verbalized understanding of date and time of appt. Pt advised can take Motrin 800 mg with food and water one hour before procedure. Pt agreeable.   Routing to Dr Hyacinth Meeker for review. Encounter closed.  Cc: Hedda Slade for precert. Orders already placed.

## 2019-12-13 NOTE — Telephone Encounter (Signed)
Patient is returning call.  °

## 2019-12-13 NOTE — Telephone Encounter (Signed)
Call placed to convey benefits for colposcopy appointment 12/19/19. Ok per DPR left full detailed message with benefits.

## 2019-12-13 NOTE — Telephone Encounter (Signed)
Patient is asking for a refill of her [facility] [address] Patient would like to schedule her follow up appointment. She said according to the letter she received she must be seen before 12/23/19.

## 2019-12-13 NOTE — Telephone Encounter (Signed)
Patient returned my call and I conveyed the benefits. Patient understands/agreeable with the benefits. Patient is aware of the cancellation policy. Appointment scheduled 12/19/19. 

## 2019-12-16 NOTE — Progress Notes (Signed)
39 y.o. Single Not Hispanic or Latino female here for colposcopy with possible biopsies and/or ECC due to ASCUS cannot rule out high grade pap obtained 10/07/2019.  Does have hx of AGUS pap.  Colpo showed atypia and ECC was negative.  Endometrial biopsy was recommended.  Pt declined coming back for this.   Patient's last menstrual period was 12/14/2019 (exact date).          Sexually active: Yes.    The current method of family planning is BTL and Depo provera.     Patient has been counseled about results and procedure.  Risks and benefits have bene reviewed including immediate and/or delayed bleeding, infection, cervical scaring from procedure, possibility of needing additional follow up as well as treatment.  rare risks of missing a lesion discussed as well.  All questions answered.  Pt ready to proceed.  BP 110/70    Pulse 68    Temp (!) 97.5 F (36.4 C) (Skin)    Resp 16    Wt 152 lb (68.9 kg)    LMP 12/14/2019 (Exact Date)    BMI 23.28 kg/m   Physical Exam  GI: Normal appearance.  Genitourinary:    Vagina and cervix normal.        Genitourinary Comments: NAEFG, no lesions or masses, vagina normal   Neurological: She is alert.    Speculum placed.  3% acetic acid applied to cervix for >45 seconds.  Cervix visualized with both 7.5X and 15X magnification.  Green filter also used.  Lugols solution was used.  Findings:  Noted above.  AWE with mosaicism at 1 o'clock, decreased staining with Lugol's at 8 o'clock.  Biopsy:  1 and 8.  ECC:  was performed.  Monsel's was not needed.  Excellent hemostasis was present.  Pt tolerated procedure well and all instruments were removed.   Chaperone, Cornelia Copa, CMA, was present during procedure.  Assessment:  ASCUS cannot r/o HGSIL pap, +HR HPV  Plan:  Pathology results will be called to patient and follow-up planned pending results.

## 2019-12-19 ENCOUNTER — Ambulatory Visit: Payer: Managed Care, Other (non HMO) | Admitting: Obstetrics & Gynecology

## 2019-12-19 ENCOUNTER — Other Ambulatory Visit: Payer: Self-pay

## 2019-12-19 ENCOUNTER — Encounter: Payer: Self-pay | Admitting: Obstetrics & Gynecology

## 2019-12-19 ENCOUNTER — Other Ambulatory Visit (HOSPITAL_COMMUNITY)
Admission: RE | Admit: 2019-12-19 | Discharge: 2019-12-19 | Disposition: A | Discharge status: Home / Self Care | Payer: Managed Care, Other (non HMO) | Source: Ambulatory Visit | Attending: Obstetrics & Gynecology | Admitting: Obstetrics & Gynecology

## 2019-12-19 DIAGNOSIS — R87810 Cervical high risk human papillomavirus (HPV) DNA test positive: Secondary | ICD-10-CM

## 2019-12-19 DIAGNOSIS — R87611 Atypical squamous cells cannot exclude high grade squamous intraepithelial lesion on cytologic smear of cervix (ASC-H): Secondary | ICD-10-CM | POA: Diagnosis not present

## 2019-12-19 NOTE — Patient Instructions (Signed)

## 2019-12-20 LAB — SURGICAL PATHOLOGY

## 2019-12-30 ENCOUNTER — Telehealth: Payer: Self-pay | Admitting: *Deleted

## 2019-12-30 DIAGNOSIS — R87810 Cervical high risk human papillomavirus (HPV) DNA test positive: Secondary | ICD-10-CM

## 2019-12-30 DIAGNOSIS — R87611 Atypical squamous cells cannot exclude high grade squamous intraepithelial lesion on cytologic smear of cervix (ASC-H): Secondary | ICD-10-CM

## 2019-12-30 DIAGNOSIS — N870 Mild cervical dysplasia: Secondary | ICD-10-CM

## 2019-12-30 NOTE — Telephone Encounter (Signed)
Spoke with patient. Advised of results as seen below per Dr. Hyacinth Meeker. Patient is agreeable to proceed with LEEP, request to schedule towards end of July. Hx of BTL, also on depo-provera. LMP 12/13/19. Brief explanation of LEEP provided.   LEEP scheduled for 01/22/20 at 1:30pm with Dr. Hyacinth Meeker.  Order placed for precert.  Advised to take Motrin 800 mg with food and water one hour before procedure.  Routing to covering provider for final review. Patient is agreeable to disposition. Will close encounter.  Cc: Webb Silversmith, [place]

## 2019-12-30 NOTE — Telephone Encounter (Signed)
Leda Min, RN  12/30/2019 12:50 PM EDT Back to Top    Left message to call Noreene Larsson, RN at [facility] [phone]

## 2019-12-30 NOTE — Telephone Encounter (Signed)
-----   Message from Jerene Bears, MD sent at 12/27/2019  6:10 AM EDT ----- Please let pt know her biopsies all showed CIN 1.  ECC was negative.  Pap was ASCUS H so needs LEEP.    CC:  Cornelia Copa, CMA

## 2020-01-01 ENCOUNTER — Telehealth: Payer: Self-pay | Admitting: Obstetrics & Gynecology

## 2020-01-01 NOTE — Telephone Encounter (Signed)
Call placed to convey benefits for LEEP procedure. °

## 2020-01-07 NOTE — Telephone Encounter (Signed)
Patient returned my call and I conveyed the benefits. Patient understands/agreeable with the benefits. Patient is aware of the cancellation policy. Appointment scheduled 01/22/20.

## 2020-01-22 ENCOUNTER — Ambulatory Visit: Payer: Managed Care, Other (non HMO) | Admitting: Obstetrics & Gynecology

## 2020-01-22 ENCOUNTER — Telehealth: Payer: Self-pay | Admitting: Obstetrics & Gynecology

## 2020-01-22 ENCOUNTER — Other Ambulatory Visit (HOSPITAL_COMMUNITY)
Admission: RE | Admit: 2020-01-22 | Discharge: 2020-01-22 | Disposition: A | Discharge status: Home / Self Care | Payer: Managed Care, Other (non HMO) | Source: Ambulatory Visit | Attending: Obstetrics & Gynecology | Admitting: Obstetrics & Gynecology

## 2020-01-22 ENCOUNTER — Encounter: Payer: Self-pay | Admitting: Obstetrics & Gynecology

## 2020-01-22 ENCOUNTER — Other Ambulatory Visit: Payer: Self-pay

## 2020-01-22 VITALS — BP 120/70 | HR 60 | Resp 14 | Ht 67.0 in | Wt 152.5 lb

## 2020-01-22 DIAGNOSIS — R87611 Atypical squamous cells cannot exclude high grade squamous intraepithelial lesion on cytologic smear of cervix (ASC-H): Secondary | ICD-10-CM | POA: Diagnosis not present

## 2020-01-22 DIAGNOSIS — B977 Papillomavirus as the cause of diseases classified elsewhere: Secondary | ICD-10-CM

## 2020-01-22 DIAGNOSIS — N870 Mild cervical dysplasia: Secondary | ICD-10-CM | POA: Insufficient documentation

## 2020-01-22 DIAGNOSIS — Z9889 Other specified postprocedural states: Secondary | ICD-10-CM | POA: Insufficient documentation

## 2020-01-22 DIAGNOSIS — R87810 Cervical high risk human papillomavirus (HPV) DNA test positive: Secondary | ICD-10-CM

## 2020-01-22 NOTE — Telephone Encounter (Signed)
Spoke with patient. 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 OV scheduled for 02/21/20 at 11:30am with Dr. Hyacinth Meeker.   Routing to provider for final review. Patient is agreeable to disposition. Will close encounter.

## 2020-01-22 NOTE — Progress Notes (Addendum)
39 y.o. G4P4 Single AA Fhere for LEEP due to ASCUS with possible HGSIL cells and cin 1 on cervical biopsies.  Pt does not cycle and is on long term Depo Provera.  No LMP recorded. Patient has had an injection.          Sexually active: No.  Not currently. The current method of family planning is Depo-Provera injections.     Pre-procedure vitals: Blood pressure 120/70, pulse 60, resp. rate 14, height 5\' 7"  (1.702 m), weight 152 lb 8 oz (69.2 kg).   Procedure explained and patient's questions were invited and answered.   Consent form signed.  Pre-procedure medication:  800mg  Motrin.  Time given:  1300  Procedure Set-up: Grounding pad located left thigh.  Cautery settings: 45 cut/45 coagulation.  Suction applied to coated speculum.  Procedure:  Speculum placed with good visualization of the cervix.  Colposcopy performed showing:  visible lesion(s) at 1 and 8 o'clock c/w findings at time of colposcopy.  Cervix anesthetized using 2% Xylocaine with 1:100,000units Epinephrine.  Lot:  ex: 08/2020.  8 cc's used.  Entire transition zone excised with 10 x 10 loop in 2 passes.  Specimen(s) placed on cork and labeled for pathology.  Hemostasis obtained with ball cautery and Monsel's solution.  EBL:  Minimal  Complications:  none  Patient tolerated procedure well and left the office in satisfactory condition.  Plan:  Follow up 1 month for recheck of cervix and discussion of follow up recommendations.  Pelvic rest recommended until that time.

## 2020-01-22 NOTE — Telephone Encounter (Signed)
Patient will need a one month LEEP fu with Dr Hyacinth Meeker. Offered Friday 8/27 but because of work, she declined.

## 2020-01-24 LAB — SURGICAL PATHOLOGY

## 2020-01-28 ENCOUNTER — Telehealth: Payer: Self-pay | Admitting: *Deleted

## 2020-01-28 NOTE — Telephone Encounter (Signed)
Left message to call Noreene Larsson, RN at [facility] [phone].   6 mo and 12 mo recalls placed

## 2020-01-28 NOTE — Telephone Encounter (Signed)
-----   Message from Jerene Bears, MD sent at 01/24/2020  4:12 PM EDT ----- Please let pt know her LEEP showed CIN 2 and 3, high grade changes.  Margin is positive so needs repeat pap in 6 months and 12 months.  She has 1 month follow up scheduled.  Will plan appts when she returns for follow up.  Thank.s

## 2020-01-29 NOTE — Telephone Encounter (Addendum)
Spoke with patient, advised as seen below per Dr. Hyacinth Meeker. Patient verbalizes understanding and is agreeable. F/u scheduled for 02/21/20.  Patient reports increased cramping since LEEP and light bleeding, requires a panty liner. She is taking OTC BC powders. Denies fever/chills, N/V, vaginal odor.  Recommended patient try OTC ibuprofen or motrin and/or heating pad. If new symptoms develop or symptoms worsen or do not resolve, return call to the office. Patient verbalizes understanding and is agreeable.   Routing to provider for final review. Patient is agreeable to disposition. Will close encounter.

## 2020-01-29 NOTE — Telephone Encounter (Signed)
Please tell her to stop the [facility] powders as this is aspirin and will increase bleeding as it impairs her platelet function.  Also, let her know it takes 5 days for platelets to fully recover after aspirin exposure.  Thanks.

## 2020-01-29 NOTE — Telephone Encounter (Signed)
Call returned to patient, left detailed message, ok per dpr. Advised as seen below per Dr. Hyacinth Meeker, return call to the office if any additional questions.

## 2020-02-12 NOTE — Progress Notes (Deleted)
GYNECOLOGY  VISIT  CC:   ***  HPI: 38 y.o. G46P4 Single Black or [place] American female here for 1 month LEEP follow up.  GYNECOLOGIC HISTORY: No LMP recorded. Patient has had an injection. Contraception: *** Menopausal hormone therapy: ***  Patient Active Problem List   Diagnosis Date Noted  . 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 Dysplasia of cervix, low grade (CIN 1) 01/22/2020  . DUB (dysfunctional uterine bleeding) 06/08/2018  . DVT complicating pregnancy 03/02/2017  . HSV (herpes simplex virus) infection 03/02/2017    Past Medical History:  Diagnosis Date  . Abnormal Pap smear of cervix    10-07-2019 ASCUS cannot exclude HGSIL HPV HR+  . DVT (deep venous thrombosis) (HCC)    in left leg per patient  . STD (sexually transmitted disease)    Genital Herpes    Past Surgical History:  Procedure Laterality Date  . CESAREAN SECTION    . TUBAL LIGATION      MEDS:   Current Outpatient Medications on File Prior to Visit  Medication Sig Dispense Refill  . medroxyPROGESTERone Acetate 150 MG/ML SUSY Inject 1 mL into the muscle every 3 (three) months.    . Multiple Vitamins-Calcium (ONE-A-DAY WOMENS PO) Take 2 tablets by mouth daily.     No current facility-administered medications on file prior to visit.    ALLERGIES: Patient has no known allergies.  Family History  Problem Relation Age of Onset  . Colon cancer Mother 35       stage IV, negative genetic testing  . Diabetes Maternal Grandmother     SH:  ***  Review of Systems  PHYSICAL EXAMINATION:    There were no vitals taken for this visit.    General appearance: alert, cooperative and appears stated age Neck: no adenopathy, supple, symmetrical, trachea midline and thyroid {CHL AMB PHY EX THYROID NORM DEFAULT:[phone]::"normal to inspection and palpation"} CV:  {Exam; heart brief:31539} Lungs:  {pe lungs ob:314451::"clear to auscultation, no wheezes, rales or rhonchi, symmetric air entry"} Breasts: {Exam;  breast:13139::"normal appearance, no masses or tenderness"} Abdomen: soft, non-tender; bowel sounds normal; no masses,  no organomegaly Lymph:  no inguinal LAD noted  Pelvic: External genitalia:  no lesions              Urethra:  normal appearing urethra with no masses, tenderness or lesions              Bartholins and Skenes: normal                 Vagina: normal appearing vagina with normal color and discharge, no lesions              Cervix: {CHL AMB PHY EX CERVIX NORM DEFAULT:[phone]::"no lesions"}              Bimanual Exam:  Uterus:  {CHL AMB PHY EX UTERUS NORM DEFAULT:[phone]::"normal size, contour, position, consistency, mobility, non-tender"}              Adnexa: {CHL AMB PHY EX ADNEXA NO MASS DEFAULT:[phone]::"no mass, fullness, tenderness"}              Rectovaginal: {yes no:314532}.  Confirms.              Anus:  normal sphincter tone, no lesions  Chaperone, ***Zenovia Jordan, CMA, was present for exam.  Assessment: ***  Plan: ***   ~{NUMBERS; -10-45 JOINT ROM:10287} minutes spent with patient >50% of time was in face to face discussion of above.

## 2020-02-20 ENCOUNTER — Telehealth: Payer: Self-pay

## 2020-02-20 NOTE — Telephone Encounter (Signed)
Patient cancelled office visit for follow up due to "just getting over covid". Patient stated she will call back at later time to reschedule.

## 2020-02-21 ENCOUNTER — Ambulatory Visit: Payer: Self-pay | Admitting: Obstetrics & Gynecology

## 2020-03-24 ENCOUNTER — Encounter: Payer: Self-pay | Admitting: Obstetrics & Gynecology

## 2020-03-27 ENCOUNTER — Telehealth: Payer: Self-pay

## 2020-03-27 NOTE — Telephone Encounter (Signed)
-----   Message from Jerene Bears, MD sent at 02/20/2020 12:58 PM EDT ----- Regarding: follow up after [name],  This pt was supposed to follow up after having a LEEP but called to cancel as she is recovering from Covid.  Could you please put her in your inbox so we can call her for follow up in a few weeks?  Thanks.  Brittney Franco

## 2020-03-27 NOTE — Telephone Encounter (Signed)
Left message for callback to scheduled follow up after LEEP.

## 2020-04-06 NOTE — Telephone Encounter (Signed)
Left patient message for her to callback to schedule follow up appointment.

## 2020-04-09 NOTE — Telephone Encounter (Signed)
No callback from patient or appointment scheduled. Does patient need a special letter sent to her? Routing to Dr Hyacinth Meeker.

## 2020-04-10 ENCOUNTER — Encounter: Payer: Self-pay | Admitting: Obstetrics & Gynecology

## 2020-04-10 NOTE — Telephone Encounter (Signed)
Letter written and routed to you and the pt.  Could a paper copy be mailed as well?  She does need a six month recall 07/2020 if has not been made yet.  Thanks.

## 2020-04-10 NOTE — Telephone Encounter (Signed)
Recall placed for 07/2020

## 2020-04-10 NOTE — Telephone Encounter (Signed)
Letter printed & mailed to patient.

## 2020-07-29 ENCOUNTER — Ambulatory Visit: Payer: Managed Care, Other (non HMO) | Admitting: Obstetrics & Gynecology

## 2020-09-04 ENCOUNTER — Ambulatory Visit (INDEPENDENT_AMBULATORY_CARE_PROVIDER_SITE_OTHER): Payer: 59 | Admitting: Obstetrics & Gynecology

## 2020-09-04 ENCOUNTER — Other Ambulatory Visit (HOSPITAL_COMMUNITY)
Admission: RE | Admit: 2020-09-04 | Discharge: 2020-09-04 | Disposition: A | Discharge status: Home / Self Care | Payer: 59 | Source: Ambulatory Visit | Attending: Obstetrics & Gynecology | Admitting: Obstetrics & Gynecology

## 2020-09-04 ENCOUNTER — Other Ambulatory Visit: Payer: Self-pay

## 2020-09-04 ENCOUNTER — Encounter (HOSPITAL_BASED_OUTPATIENT_CLINIC_OR_DEPARTMENT_OTHER): Payer: Self-pay | Admitting: Obstetrics & Gynecology

## 2020-09-04 VITALS — BP 113/84 | HR 72 | Ht 67.0 in | Wt 159.0 lb

## 2020-09-04 DIAGNOSIS — Z1231 Encounter for screening mammogram for malignant neoplasm of breast: Secondary | ICD-10-CM

## 2020-09-04 DIAGNOSIS — I82409 Acute embolism and thrombosis of unspecified deep veins of unspecified lower extremity: Secondary | ICD-10-CM

## 2020-09-04 DIAGNOSIS — N871 Moderate cervical dysplasia: Secondary | ICD-10-CM | POA: Diagnosis not present

## 2020-09-04 DIAGNOSIS — R87810 Cervical high risk human papillomavirus (HPV) DNA test positive: Secondary | ICD-10-CM

## 2020-09-04 DIAGNOSIS — Z800 Family history of malignant neoplasm of digestive organs: Secondary | ICD-10-CM

## 2020-09-04 DIAGNOSIS — Z3042 Encounter for surveillance of injectable contraceptive: Secondary | ICD-10-CM

## 2020-09-04 DIAGNOSIS — Z113 Encounter for screening for infections with a predominantly sexual mode of transmission: Secondary | ICD-10-CM | POA: Insufficient documentation

## 2020-09-04 DIAGNOSIS — O2230 Deep phlebothrombosis in pregnancy, unspecified trimester: Secondary | ICD-10-CM | POA: Diagnosis not present

## 2020-09-04 DIAGNOSIS — R69 Illness, unspecified: Secondary | ICD-10-CM | POA: Diagnosis not present

## 2020-09-04 DIAGNOSIS — Z01419 Encounter for gynecological examination (general) (routine) without abnormal findings: Secondary | ICD-10-CM | POA: Diagnosis not present

## 2020-09-04 DIAGNOSIS — B009 Herpesviral infection, unspecified: Secondary | ICD-10-CM | POA: Diagnosis not present

## 2020-09-04 MED ORDER — MEDROXYPROGESTERONE ACETATE 150 MG/ML IM SUSY: 1.0000 mL | PREFILLED_SYRINGE | INTRAMUSCULAR | 4 refills | Status: DC

## 2020-09-04 NOTE — Progress Notes (Signed)
40 y.o. G60P4 Single Black or [place] American female here for annual exam.  Doing well.  On Depo Provera.  Administers to herself.  Needs RF.  No LMP recorded. Patient has had an injection.          Sexually active: Yes.    The current method of family planning is Depo-Provera injections.    Exercising: No.   Smoker:  no  Health Maintenance: Pap:  due History of abnormal Pap:  yes MMG:  Not indicated yet.  D/w pt. Colonoscopy:  Not yet TDaP:  Unsure.  Declines.     reports that she has never smoked. She has never used smokeless tobacco. She reports current alcohol use. She reports that she does not use drugs.  Past Medical History:  Diagnosis Date  . Abnormal Pap smear of cervix    10-07-2019 ASCUS cannot exclude HGSIL HPV HR+  . DVT (deep venous thrombosis) (HCC)    pt reports DVT, outside records show thrombophlebitis.  Pt was treated with heparin in pregnancies.  . STD (sexually transmitted disease)    Genital Herpes    Past Surgical History:  Procedure Laterality Date  . CESAREAN SECTION    . TUBAL LIGATION      Current Outpatient Medications  Medication Sig Dispense Refill  . medroxyPROGESTERone Acetate 150 MG/ML SUSY Inject 1 mL into the muscle every 3 (three) months.    . Multiple Vitamins-Calcium (ONE-A-DAY WOMENS PO) Take 2 tablets by mouth daily.     No current facility-administered medications for this visit.    Family History  Problem Relation Age of Onset  . Colon cancer Mother 17       stage IV, negative genetic testing  . Diabetes Maternal Grandmother     Review of Systems  All other systems reviewed and are negative.   Exam:   BP 113/84   Pulse 72   Ht 5\' 7"  (1.702 m)   Wt 159 lb (72.1 kg)   SpO2 100%   BMI 24.90 kg/m   Height: 5\' 7"  (170.2 cm)  General appearance: alert, cooperative and appears stated age Head: Normocephalic, without obvious abnormality, atraumatic Neck: no adenopathy, supple, symmetrical, trachea midline and thyroid  normal to inspection and palpation Lungs: clear to auscultation bilaterally Breasts: normal appearance, no masses or tenderness Heart: regular rate and rhythm Abdomen: soft, non-tender; bowel sounds normal; no masses,  no organomegaly Extremities: extremities normal, atraumatic, no cyanosis or edema Skin: Skin color, texture, turgor normal. No rashes or lesions Lymph nodes: Cervical, supraclavicular, and axillary nodes normal. No abnormal inguinal nodes palpated Neurologic: Grossly normal   Pelvic: External genitalia:  no lesions              Urethra:  normal appearing urethra with no masses, tenderness or lesions              Bartholins and Skenes: normal                 Vagina: normal appearing vagina with normal color and discharge, no lesions              Cervix: no lesions              Pap taken: Yes.   Bimanual Exam:  Uterus:  normal size, contour, position, consistency, mobility, non-tender              Adnexa: normal adnexa and no mass, fullness, tenderness  Rectovaginal: Confirms               Anus:  normal sphincter tone, no lesions  Chaperone, Beola Cord, CMA, was present for exam.  Assessment/Plan: 1. Well woman exam with routine gynecological exam - pap and HR HPV obtained today - order for MMG after age 31 placed - will start screening colonoscopy at age 66  2. 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 Cervical high risk HPV (human papillomavirus) test positive  4. Screening examination for STD (sexually transmitted disease) - Cervicovaginal ancillary only( [place])  5. Depo-Provera contraceptive status - medroxyPROGESTERone Acetate 150 MG/ML SUSY; Inject 1 mL (150 mg total) into the muscle every 3 (three) months.  Dispense: 0.9 mL; Refill: 4  6. HSV (herpes simplex virus) infection - does not have outbreaks and does not need RX  7. Family history of colon cancer in mother - guidelines reviewed.  Will plan starting at  age 67

## 2020-09-07 LAB — CERVICOVAGINAL ANCILLARY ONLY
Chlamydia: NEGATIVE
Comment: NEGATIVE
Comment: NEGATIVE
Comment: NORMAL
Neisseria Gonorrhea: NEGATIVE
Trichomonas: NEGATIVE

## 2020-09-08 LAB — CYTOLOGY - PAP
Comment: NEGATIVE
Diagnosis: NEGATIVE
High risk HPV: POSITIVE — AB

## 2020-09-15 ENCOUNTER — Encounter (HOSPITAL_BASED_OUTPATIENT_CLINIC_OR_DEPARTMENT_OTHER): Payer: Self-pay

## 2020-10-19 ENCOUNTER — Ambulatory Visit: Payer: Managed Care, Other (non HMO)

## 2021-02-08 ENCOUNTER — Other Ambulatory Visit (HOSPITAL_BASED_OUTPATIENT_CLINIC_OR_DEPARTMENT_OTHER): Payer: Self-pay | Admitting: Obstetrics & Gynecology

## 2021-02-08 DIAGNOSIS — Z3042 Encounter for surveillance of injectable contraceptive: Secondary | ICD-10-CM

## 2021-06-28 ENCOUNTER — Other Ambulatory Visit (HOSPITAL_BASED_OUTPATIENT_CLINIC_OR_DEPARTMENT_OTHER): Payer: Self-pay | Admitting: Obstetrics & Gynecology

## 2021-06-28 ENCOUNTER — Encounter (HOSPITAL_BASED_OUTPATIENT_CLINIC_OR_DEPARTMENT_OTHER): Payer: Self-pay | Admitting: Obstetrics & Gynecology

## 2021-09-16 ENCOUNTER — Ambulatory Visit (HOSPITAL_BASED_OUTPATIENT_CLINIC_OR_DEPARTMENT_OTHER): Payer: Managed Care, Other (non HMO) | Admitting: Radiology

## 2021-09-17 ENCOUNTER — Other Ambulatory Visit (HOSPITAL_COMMUNITY)
Admission: RE | Admit: 2021-09-17 | Discharge: 2021-09-17 | Disposition: A | Discharge status: Home / Self Care | Payer: 59 | Source: Ambulatory Visit | Attending: Obstetrics & Gynecology | Admitting: Obstetrics & Gynecology

## 2021-09-17 ENCOUNTER — Encounter (HOSPITAL_BASED_OUTPATIENT_CLINIC_OR_DEPARTMENT_OTHER): Payer: Self-pay | Admitting: Radiology

## 2021-09-17 ENCOUNTER — Encounter (HOSPITAL_BASED_OUTPATIENT_CLINIC_OR_DEPARTMENT_OTHER): Payer: Self-pay | Admitting: Obstetrics & Gynecology

## 2021-09-17 ENCOUNTER — Ambulatory Visit (INDEPENDENT_AMBULATORY_CARE_PROVIDER_SITE_OTHER): Payer: 59 | Admitting: Obstetrics & Gynecology

## 2021-09-17 ENCOUNTER — Ambulatory Visit (HOSPITAL_BASED_OUTPATIENT_CLINIC_OR_DEPARTMENT_OTHER)
Admission: RE | Admit: 2021-09-17 | Discharge: 2021-09-17 | Disposition: A | Discharge status: Home / Self Care | Payer: 59 | Source: Ambulatory Visit | Attending: Obstetrics & Gynecology | Admitting: Obstetrics & Gynecology

## 2021-09-17 ENCOUNTER — Other Ambulatory Visit: Payer: Self-pay

## 2021-09-17 VITALS — BP 122/85 | HR 77 | Ht 67.0 in | Wt 156.8 lb

## 2021-09-17 DIAGNOSIS — Z01419 Encounter for gynecological examination (general) (routine) without abnormal findings: Secondary | ICD-10-CM

## 2021-09-17 DIAGNOSIS — Z113 Encounter for screening for infections with a predominantly sexual mode of transmission: Secondary | ICD-10-CM | POA: Insufficient documentation

## 2021-09-17 DIAGNOSIS — Z0000 Encounter for general adult medical examination without abnormal findings: Secondary | ICD-10-CM

## 2021-09-17 DIAGNOSIS — Z1231 Encounter for screening mammogram for malignant neoplasm of breast: Secondary | ICD-10-CM | POA: Insufficient documentation

## 2021-09-17 DIAGNOSIS — N870 Mild cervical dysplasia: Secondary | ICD-10-CM

## 2021-09-17 DIAGNOSIS — B977 Papillomavirus as the cause of diseases classified elsewhere: Secondary | ICD-10-CM | POA: Diagnosis not present

## 2021-09-17 NOTE — Progress Notes (Signed)
41 y.o. G70P4 Single Black or [place] American female here for annual exam.  Stopped Depo provera due to concerns about hair thinning.  Has not had a cycle yet.   ? ?No LMP recorded. Patient has had an injection.          ?Sexually active: Yes.    ?The current method of family planning is was using Depo Provera.    ?Smoker:  no ? ?Health Maintenance: ?Pap:  09/04/2020 Positve HPV ?History of abnormal Pap:  yes ?MMG:  scheduled today ?Screening Labs: sti screening ? ? reports that she has never smoked. She has never used smokeless tobacco. She reports current alcohol use. She reports that she does not use drugs. ? ?Past Medical History:  ?Diagnosis Date  ? Abnormal Pap smear of cervix   ? 10-07-2019 ASCUS cannot exclude HGSIL HPV HR+  ? DVT (deep venous thrombosis) (HCC)   ? pt reports DVT, outside records show thrombophlebitis.  Pt was treated with heparin in pregnancies.  ? STD (sexually transmitted disease)   ? Genital Herpes  ? ? ?Past Surgical History:  ?Procedure Laterality Date  ? CESAREAN SECTION    ? TUBAL LIGATION    ? ? ?Current Outpatient Medications  ?Medication Sig Dispense Refill  ? Multiple Vitamins-Calcium (ONE-A-DAY WOMENS PO) Take 2 tablets by mouth daily. (Patient not taking: Reported on 09/17/2021)    ? ?No current facility-administered medications for this visit.  ? ? ?Family History  ?Problem Relation Age of Onset  ? Colon cancer Mother 20  ?     stage IV, negative genetic testing  ? Diabetes Maternal Grandmother   ? ? ?Review of Systems  ?Neurological:  Negative for syncope.  ?All other systems reviewed and are negative. ? ?Exam:   ?BP 122/85 (BP Location: Left Arm, Patient Position: Sitting, Cuff Size: Normal)   Pulse 77   Ht 5\' 7"  (1.702 m)   Wt 156 lb 12.8 oz (71.1 kg)   BMI 24.56 kg/m?   Height: 5\' 7"  (170.2 cm) ? ?General appearance: alert, cooperative and appears stated age ?Head: Normocephalic, without obvious abnormality, atraumatic ?Neck: no adenopathy, supple, symmetrical, trachea  midline and thyroid normal to inspection and palpation ?Lungs: clear to auscultation bilaterally ?Breasts: normal appearance, no masses or tenderness ?Heart: regular rate and rhythm ?Abdomen: soft, non-tender; bowel sounds normal; no masses,  no organomegaly ?Extremities: extremities normal, atraumatic, no cyanosis or edema ?Skin: Skin color, texture, turgor normal. No rashes or lesions ?Lymph nodes: Cervical, supraclavicular, and axillary nodes normal. ?No abnormal inguinal nodes palpated ?Neurologic: Grossly normal ? ? ?Pelvic: External genitalia:  no lesions ?             Urethra:  normal appearing urethra with no masses, tenderness or lesions ?             Bartholins and Skenes: normal    ?             Vagina: normal appearing vagina with normal color and no discharge, no lesions ?             Cervix: no lesions ?             Pap taken: Yes.   ?Bimanual Exam:  Uterus:  normal size, contour, position, consistency, mobility, non-tender ?             Adnexa: normal adnexa and no mass, fullness, tenderness ?              Rectovaginal: Confirms ?  Anus:  normal sphincter tone, no lesions ? ?Chaperone, Ina Homes, CMA, was present for exam. ? ?Assessment/Plan: ?1. Well woman exam with routine gynecological exam ?- pap and HR HPV obtained today ?- MMG scheduled today ?- colonoscopy recommended age 62 ?- lab work ordered today ? ?2. High risk HPV infection ?- Cytology - PAP( [place]) ? ?3. Routine screening for STI (sexually transmitted infection) ?- GC/chl, HIV/RPR/Hep b and c obtained today ? ?4. Blood tests for routine general physical examination ?- CMP, lipids, TSH and CBC obtained ? ?5. Dysplasia of cervix, low grade (CIN 1) ? ? ? ?

## 2021-09-18 LAB — COMPREHENSIVE METABOLIC PANEL
ALT: 13 IU/L (ref 0–32)
AST: 23 IU/L (ref 0–40)
Albumin/Globulin Ratio: 1.4 (ref 1.2–2.2)
Albumin: 4.4 g/dL (ref 3.8–4.8)
Alkaline Phosphatase: 77 IU/L (ref 44–121)
BUN/Creatinine Ratio: 5 — ABNORMAL LOW (ref 9–23)
BUN: 4 mg/dL — ABNORMAL LOW (ref 6–24)
Bilirubin Total: 0.4 mg/dL (ref 0.0–1.2)
CO2: 20 mmol/L (ref 20–29)
Calcium: 9 mg/dL (ref 8.7–10.2)
Chloride: 103 mmol/L (ref 96–106)
Creatinine, Ser: 0.76 mg/dL (ref 0.57–1.00)
Globulin, Total: 3.1 g/dL (ref 1.5–4.5)
Glucose: 65 mg/dL — ABNORMAL LOW (ref 70–99)
Potassium: 3.7 mmol/L (ref 3.5–5.2)
Sodium: 139 mmol/L (ref 134–144)
Total Protein: 7.5 g/dL (ref 6.0–8.5)
eGFR: 102 mL/min/{1.73_m2} (ref >59)

## 2021-09-18 LAB — LIPID PANEL
Chol/HDL Ratio: 4 ratio (ref 0.0–4.4)
Cholesterol, Total: 182 mg/dL (ref 100–199)
HDL: 46 mg/dL (ref >39)
LDL Chol Calc (NIH): 126 mg/dL — ABNORMAL HIGH (ref 0–99)
Triglycerides: 51 mg/dL (ref 0–149)
VLDL Cholesterol Cal: 10 mg/dL (ref 5–40)

## 2021-09-18 LAB — CBC
Hematocrit: 36.6 % (ref 34.0–46.6)
Hemoglobin: 12.1 g/dL (ref 11.1–15.9)
MCH: 28.1 pg (ref 26.6–33.0)
MCHC: 33.1 g/dL (ref 31.5–35.7)
MCV: 85 fL (ref 79–97)
Platelets: 232 10*3/uL (ref 150–450)
RBC: 4.3 x10E6/uL (ref 3.77–5.28)
RDW: 12.7 % (ref 11.7–15.4)
WBC: 4.6 10*3/uL (ref 3.4–10.8)

## 2021-09-18 LAB — RPR+HBSAG+HIV
HIV Screen 4th Generation wRfx: NONREACTIVE
Hepatitis B Surface Ag: NEGATIVE
RPR Ser Ql: NONREACTIVE

## 2021-09-18 LAB — TSH: TSH: 1.33 u[IU]/mL (ref 0.450–4.500)

## 2021-09-18 LAB — HEPATITIS C ANTIBODY: Hep C Virus Ab: NONREACTIVE

## 2021-09-20 ENCOUNTER — Ambulatory Visit (HOSPITAL_BASED_OUTPATIENT_CLINIC_OR_DEPARTMENT_OTHER): Payer: Managed Care, Other (non HMO) | Admitting: Radiology

## 2021-09-20 ENCOUNTER — Encounter (HOSPITAL_BASED_OUTPATIENT_CLINIC_OR_DEPARTMENT_OTHER): Payer: Self-pay | Admitting: Obstetrics & Gynecology

## 2021-09-20 ENCOUNTER — Other Ambulatory Visit: Payer: Self-pay | Admitting: Obstetrics & Gynecology

## 2021-09-20 DIAGNOSIS — R928 Other abnormal and inconclusive findings on diagnostic imaging of breast: Secondary | ICD-10-CM

## 2021-09-21 LAB — CYTOLOGY - PAP
Adequacy: ABSENT
Chlamydia: NEGATIVE
Comment: NEGATIVE
Comment: NEGATIVE
Comment: NORMAL
Diagnosis: NEGATIVE
High risk HPV: NEGATIVE
Neisseria Gonorrhea: NEGATIVE

## 2021-10-04 ENCOUNTER — Other Ambulatory Visit: Payer: Self-pay | Admitting: Obstetrics & Gynecology

## 2021-10-04 ENCOUNTER — Ambulatory Visit
Admission: RE | Admit: 2021-10-04 | Discharge: 2021-10-04 | Disposition: A | Discharge status: Home / Self Care | Payer: 59 | Source: Ambulatory Visit | Attending: Obstetrics & Gynecology | Admitting: Obstetrics & Gynecology

## 2021-10-04 DIAGNOSIS — R928 Other abnormal and inconclusive findings on diagnostic imaging of breast: Secondary | ICD-10-CM

## 2022-03-18 ENCOUNTER — Encounter (HOSPITAL_BASED_OUTPATIENT_CLINIC_OR_DEPARTMENT_OTHER): Payer: Self-pay | Admitting: Obstetrics & Gynecology

## 2022-03-18 ENCOUNTER — Other Ambulatory Visit (HOSPITAL_BASED_OUTPATIENT_CLINIC_OR_DEPARTMENT_OTHER): Payer: Self-pay | Admitting: Obstetrics & Gynecology

## 2022-03-18 DIAGNOSIS — Z30011 Encounter for initial prescription of contraceptive pills: Secondary | ICD-10-CM

## 2022-03-18 MED ORDER — NORETHINDRONE 0.35 MG PO TABS: 1.0000 | ORAL_TABLET | Freq: Every day | ORAL | 3 refills | Status: DC

## 2022-06-09 ENCOUNTER — Other Ambulatory Visit (HOSPITAL_BASED_OUTPATIENT_CLINIC_OR_DEPARTMENT_OTHER): Payer: Self-pay | Admitting: Obstetrics & Gynecology

## 2022-06-09 DIAGNOSIS — Z30011 Encounter for initial prescription of contraceptive pills: Secondary | ICD-10-CM

## 2022-06-09 NOTE — Telephone Encounter (Signed)
LMOVM for pt to call regarding refill request 

## 2022-06-13 ENCOUNTER — Encounter (HOSPITAL_BASED_OUTPATIENT_CLINIC_OR_DEPARTMENT_OTHER): Payer: Self-pay | Admitting: Obstetrics & Gynecology

## 2022-08-24 ENCOUNTER — Other Ambulatory Visit: Payer: Self-pay | Admitting: Obstetrics & Gynecology

## 2022-08-24 DIAGNOSIS — Z1231 Encounter for screening mammogram for malignant neoplasm of breast: Secondary | ICD-10-CM

## 2022-09-20 ENCOUNTER — Encounter (HOSPITAL_BASED_OUTPATIENT_CLINIC_OR_DEPARTMENT_OTHER): Payer: Self-pay | Admitting: Obstetrics & Gynecology

## 2022-09-20 ENCOUNTER — Other Ambulatory Visit (HOSPITAL_BASED_OUTPATIENT_CLINIC_OR_DEPARTMENT_OTHER): Payer: Self-pay | Admitting: Obstetrics & Gynecology

## 2022-09-20 DIAGNOSIS — Z30011 Encounter for initial prescription of contraceptive pills: Secondary | ICD-10-CM

## 2022-10-11 ENCOUNTER — Other Ambulatory Visit (HOSPITAL_COMMUNITY)
Admission: RE | Admit: 2022-10-11 | Discharge: 2022-10-11 | Disposition: A | Discharge status: Home / Self Care | Payer: 59 | Source: Ambulatory Visit | Attending: Obstetrics & Gynecology | Admitting: Obstetrics & Gynecology

## 2022-10-11 ENCOUNTER — Encounter (HOSPITAL_BASED_OUTPATIENT_CLINIC_OR_DEPARTMENT_OTHER): Payer: Self-pay | Admitting: Obstetrics & Gynecology

## 2022-10-11 ENCOUNTER — Ambulatory Visit
Admission: RE | Admit: 2022-10-11 | Discharge: 2022-10-11 | Disposition: A | Discharge status: Home / Self Care | Payer: 59 | Source: Ambulatory Visit | Attending: Obstetrics & Gynecology | Admitting: Obstetrics & Gynecology

## 2022-10-11 ENCOUNTER — Ambulatory Visit (INDEPENDENT_AMBULATORY_CARE_PROVIDER_SITE_OTHER): Payer: 59 | Admitting: Obstetrics & Gynecology

## 2022-10-11 VITALS — BP 115/76 | HR 71 | Ht 67.0 in | Wt 154.0 lb

## 2022-10-11 DIAGNOSIS — N870 Mild cervical dysplasia: Secondary | ICD-10-CM | POA: Diagnosis not present

## 2022-10-11 DIAGNOSIS — Z1231 Encounter for screening mammogram for malignant neoplasm of breast: Secondary | ICD-10-CM

## 2022-10-11 DIAGNOSIS — B009 Herpesviral infection, unspecified: Secondary | ICD-10-CM

## 2022-10-11 DIAGNOSIS — Z800 Family history of malignant neoplasm of digestive organs: Secondary | ICD-10-CM

## 2022-10-11 DIAGNOSIS — Z01419 Encounter for gynecological examination (general) (routine) without abnormal findings: Secondary | ICD-10-CM | POA: Diagnosis not present

## 2022-10-11 DIAGNOSIS — Z86718 Personal history of other venous thrombosis and embolism: Secondary | ICD-10-CM | POA: Diagnosis not present

## 2022-10-11 DIAGNOSIS — Z30011 Encounter for initial prescription of contraceptive pills: Secondary | ICD-10-CM

## 2022-10-11 DIAGNOSIS — Z124 Encounter for screening for malignant neoplasm of cervix: Secondary | ICD-10-CM

## 2022-10-11 DIAGNOSIS — Z3041 Encounter for surveillance of contraceptive pills: Secondary | ICD-10-CM

## 2022-10-11 MED ORDER — NORETHINDRONE 0.35 MG PO TABS: 1.0000 | ORAL_TABLET | Freq: Every day | ORAL | 4 refills | Status: DC

## 2022-10-11 NOTE — Progress Notes (Signed)
42 y.o. G67P4 Single Black or [place] American female here for annual exam.  Family is doing well.  Currently on micronor.  Bleeding is irregular.  Used depo provera in the past.  Had amenorrhea.  Stopped due to concerns about bone loss and hair thinning.  Considering restarting as she feels the hair issues were actually not related.  No LMP recorded. (Menstrual status: Oral contraceptives).          Sexually active: Yes.    The current method of family planning is oral progesterone-only contraceptive.    Smoker:  no  Health Maintenance: Pap:  09/17/2021 Negative History of abnormal Pap:  yes MMG:  done today Colonoscopy:  guidelines reviewed Screening Labs: done 09/2021   reports that she has never smoked. She has never used smokeless tobacco. She reports current alcohol use. She reports that she does not use drugs.  Past Medical History:  Diagnosis Date   Abnormal Pap smear of cervix    10-07-2019 ASCUS cannot exclude HGSIL HPV HR+   DVT (deep venous thrombosis)    pt reports DVT, outside records show thrombophlebitis.  Pt was treated with heparin in pregnancies.   STD (sexually transmitted disease)    Genital Herpes    Past Surgical History:  Procedure Laterality Date   CESAREAN SECTION     TUBAL LIGATION      Current Outpatient Medications  Medication Sig Dispense Refill   norethindrone (MICRONOR) 0.35 MG tablet Take 1 tablet (0.35 mg total) by mouth daily. 84 tablet 4   No current facility-administered medications for this visit.    Family History  Problem Relation Age of Onset   Colon cancer Mother 39       stage IV, negative genetic testing   Diabetes Maternal Grandmother     ROS: Constitutional: negative Genitourinary:negative  Exam:   BP 115/76   Pulse 71   Ht 5\' 7"  (1.702 m) Comment: Reported  Wt 154 lb (69.9 kg)   BMI 24.12 kg/m   Height: 5\' 7"  (170.2 cm) (Reported)  General appearance: alert, cooperative and appears stated age Head: Normocephalic,  without obvious abnormality, atraumatic Neck: no adenopathy, supple, symmetrical, trachea midline and thyroid normal to inspection and palpation Lungs: clear to auscultation bilaterally Breasts: normal appearance, no masses or tenderness Heart: regular rate and rhythm Abdomen: soft, non-tender; bowel sounds normal; no masses,  no organomegaly Extremities: extremities normal, atraumatic, no cyanosis or edema Skin: Skin color, texture, turgor normal. No rashes or lesions Lymph nodes: Cervical, supraclavicular, and axillary nodes normal. No abnormal inguinal nodes palpated Neurologic: Grossly normal   Pelvic: External genitalia:  no lesions              Urethra:  normal appearing urethra with no masses, tenderness or lesions              Bartholins and Skenes: normal                 Vagina: normal appearing vagina with normal color and no discharge, no lesions              Cervix: no lesions              Pap taken: Yes.   Bimanual Exam:  Uterus:  uterus absent              Adnexa: no mass, fullness, tenderness               Rectovaginal: Confirms  Anus:  normal sphincter tone, no lesions  Chaperone, Ina Homes, CMA, was present for exam.  Assessment/Plan: 1. Well woman exam with routine gynecological exam - Pap smear obtained today - Mammogram done today - Colonoscopy age 75.  Guidelines reviewed. - lab work done 09/2021 - vaccines reviewed/updated.  Tdap offered today.  2. Cervical cancer screening  3. HSV (herpes simplex virus) infection  4. History of DVT (deep vein thrombosis) - pt aware should not use any estrogen  5. Dysplasia of cervix, low grade (CIN 1) - Cytology - PAP( [place])  6. Encounter for surveillance of contraceptive pills - pt is considering restarting Depo Provera - norethindrone (MICRONOR) 0.35 MG tablet; Take 1 tablet (0.35 mg total) by mouth daily.  Dispense: 84 tablet; Refill: 4  7. Family history of colon cancer - mother  diagnosed stage IV at diagnosis

## 2022-10-17 LAB — CYTOLOGY - PAP
Comment: NEGATIVE
Diagnosis: NEGATIVE
High risk HPV: NEGATIVE

## 2022-10-20 ENCOUNTER — Encounter (HOSPITAL_BASED_OUTPATIENT_CLINIC_OR_DEPARTMENT_OTHER): Payer: Self-pay | Admitting: Obstetrics & Gynecology

## 2022-10-31 ENCOUNTER — Encounter (HOSPITAL_BASED_OUTPATIENT_CLINIC_OR_DEPARTMENT_OTHER): Payer: Self-pay | Admitting: Obstetrics & Gynecology

## 2022-10-31 ENCOUNTER — Other Ambulatory Visit (HOSPITAL_BASED_OUTPATIENT_CLINIC_OR_DEPARTMENT_OTHER): Payer: Self-pay | Admitting: Obstetrics & Gynecology

## 2022-10-31 DIAGNOSIS — Z30013 Encounter for initial prescription of injectable contraceptive: Secondary | ICD-10-CM

## 2022-10-31 MED ORDER — MEDROXYPROGESTERONE ACETATE 150 MG/ML IM SUSP: 150.0000 mg | INTRAMUSCULAR | 4 refills | Status: DC

## 2022-12-04 IMAGING — MG MM DIGITAL DIAGNOSTIC UNILAT*L* W/ TOMO W/ CAD
6 series · 6 of 14 positions shown · non-contrast
Comparison: Baseline screening mammogram dated 09/17/2021.

CLINICAL DATA: Patient returns today to evaluate LEFT breast
calcifications and a LEFT axillary lymph node which were identified
on recent baseline screening mammogram.

EXAM:
DIGITAL DIAGNOSTIC UNILATERAL LEFT MAMMOGRAM WITH TOMOSYNTHESIS AND
CAD; US AXILLARY LEFT
TECHNIQUE: Left digital diagnostic mammography and breast tomosynthesis was
performed. The images were evaluated with computer-aided detection.;
Targeted ultrasound examination of the left axilla was performed.

[L ML]
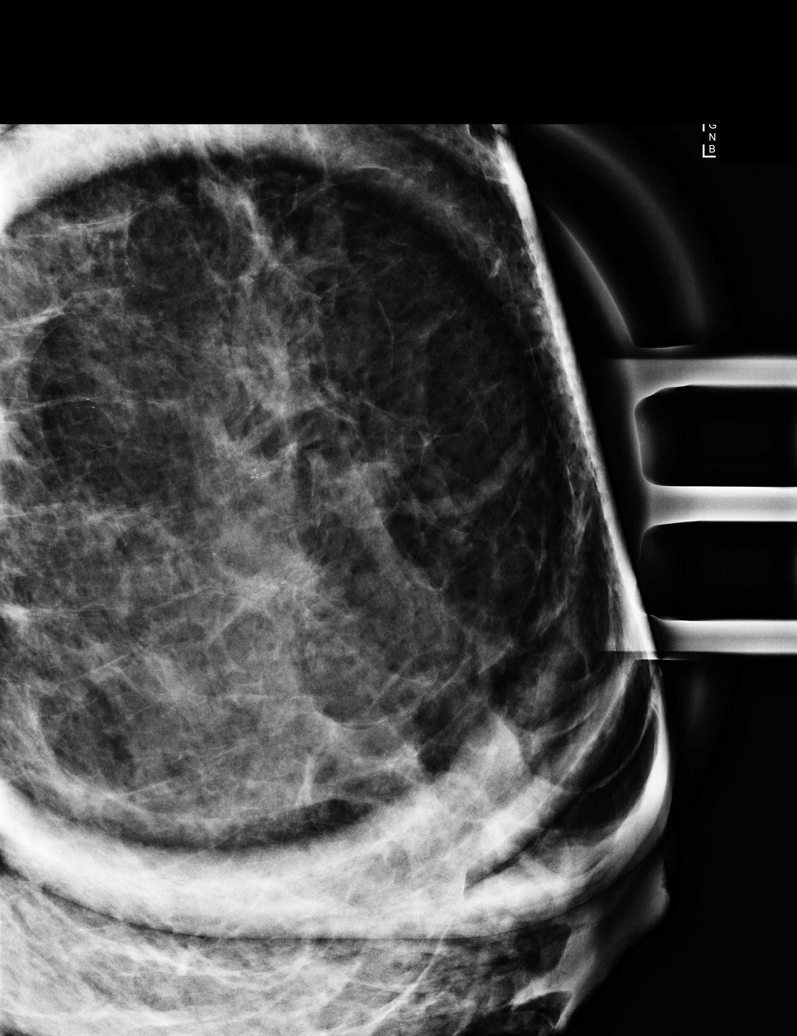

[L CC]
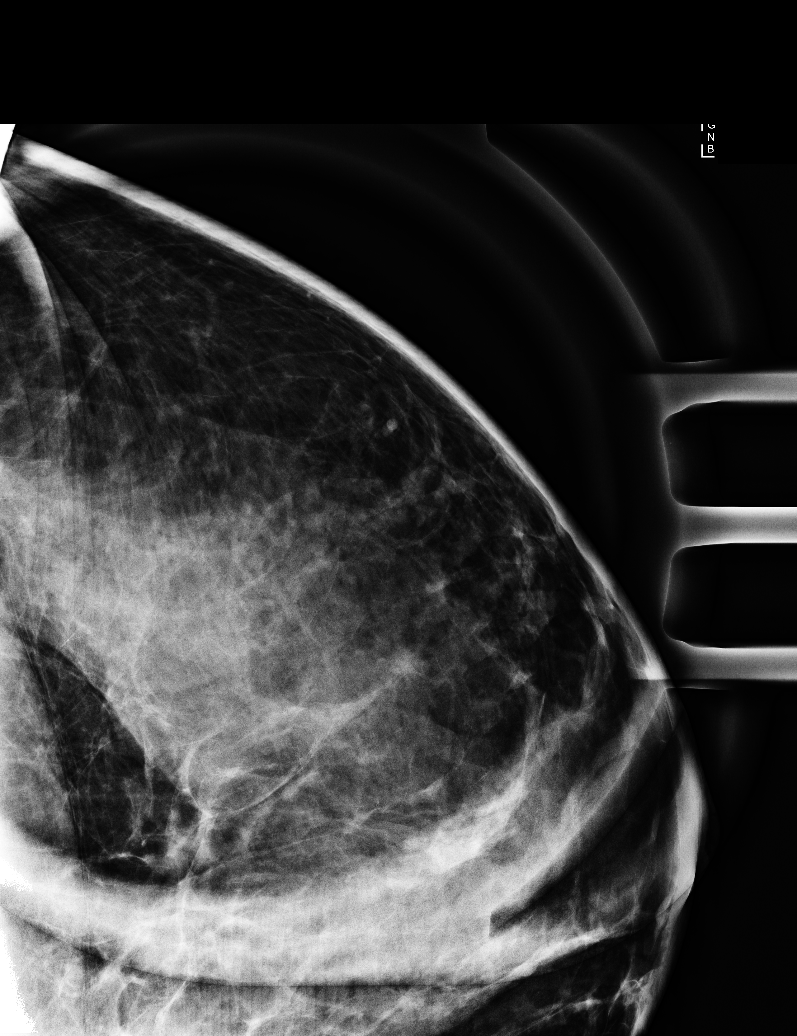

[L ML synth-2D]
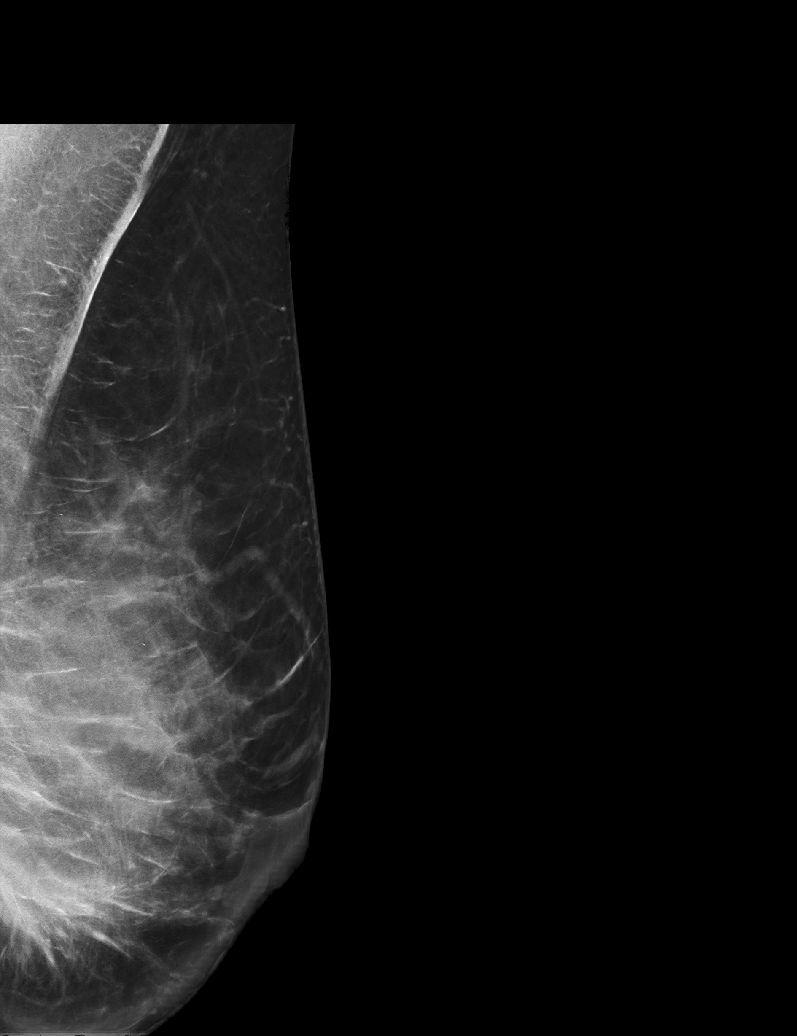

[L CC synth-2D]
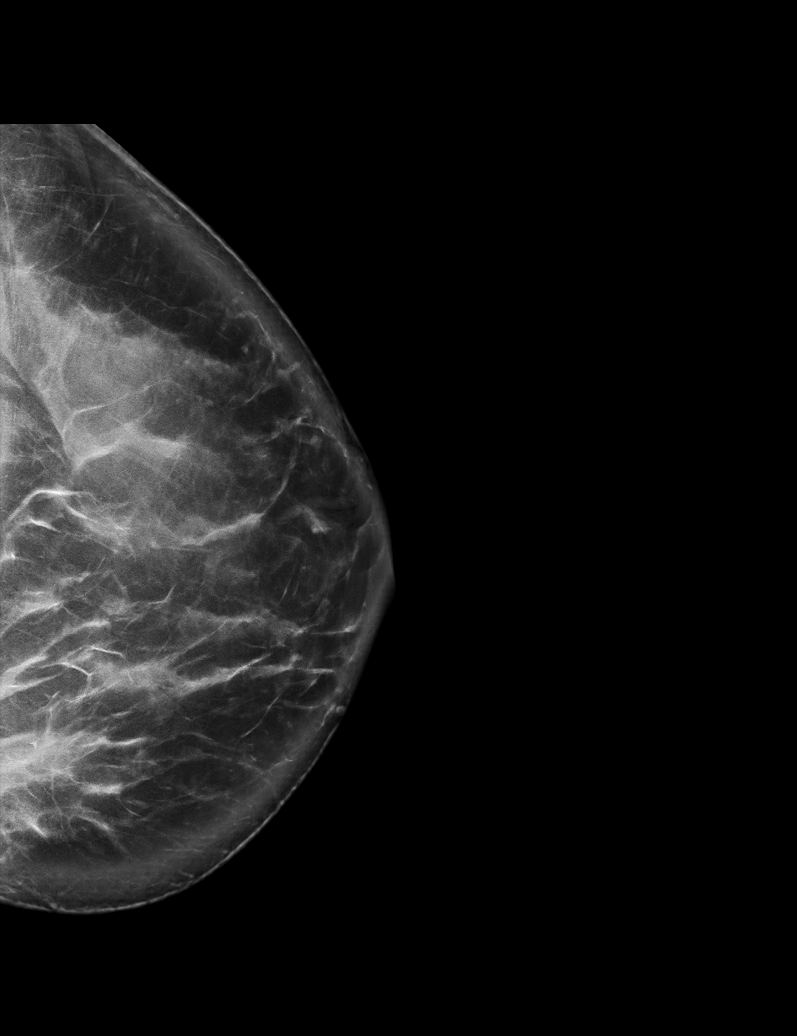

[L CC tomo · tomo slice 44/87.0]
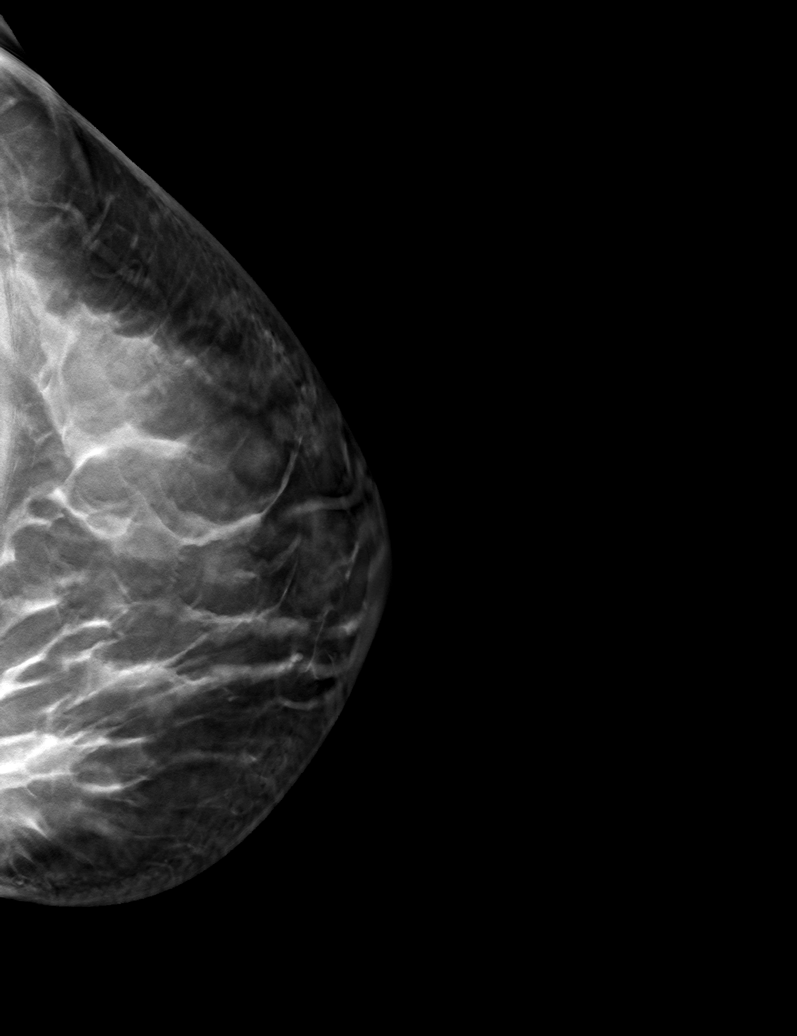

[L ML tomo · tomo slice 41/81.0]
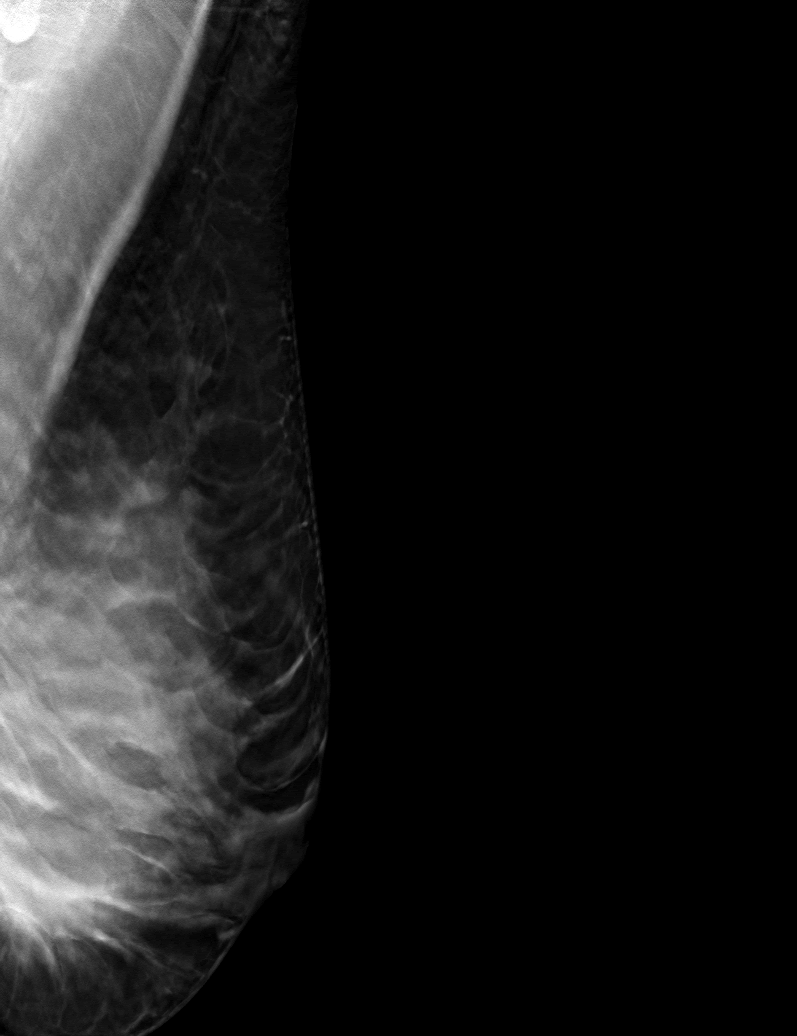

[6 of 14 positions shown; findings below may reference images not displayed]

ACR Breast Density Category c: The breast tissue is heterogeneously
dense, which may obscure small masses.
FINDINGS: On today's additional diagnostic views with magnification, the
loosely grouped punctate calcifications within the upper LEFT breast
are confirmed and the majority of these calcifications have a
layering appearance indicating benign fibrocystic change/milk of
calcium. There are no suspicious pleomorphic or fine linear
branching calcifications identified.

Targeted ultrasound is performed, showing no enlarged or
morphologically abnormal lymph nodes in the LEFT axilla.
IMPRESSION: No evidence of malignancy within the LEFT breast or LEFT axilla.
Benign-appearing calcifications within the upper LEFT breast,
consistent with benign fibrocystic change/milk of calcium.

Patient may return to routine annual bilateral screening mammogram
schedule.

RECOMMENDATION:
Screening mammogram in one year.(Code:97-[id number])

I have discussed the findings and recommendations with the patient.
If applicable, a reminder letter will be sent to the patient
regarding the next appointment.

BI-RADS CATEGORY  2: Benign.

## 2023-02-16 ENCOUNTER — Encounter (HOSPITAL_BASED_OUTPATIENT_CLINIC_OR_DEPARTMENT_OTHER): Payer: Self-pay | Admitting: Obstetrics & Gynecology

## 2023-09-07 ENCOUNTER — Other Ambulatory Visit: Payer: Self-pay | Admitting: Obstetrics & Gynecology

## 2023-09-07 DIAGNOSIS — Z1231 Encounter for screening mammogram for malignant neoplasm of breast: Secondary | ICD-10-CM

## 2023-10-19 ENCOUNTER — Ambulatory Visit (HOSPITAL_BASED_OUTPATIENT_CLINIC_OR_DEPARTMENT_OTHER): Payer: 59 | Admitting: Obstetrics & Gynecology

## 2023-10-20 ENCOUNTER — Ambulatory Visit (HOSPITAL_BASED_OUTPATIENT_CLINIC_OR_DEPARTMENT_OTHER): Payer: 59 | Admitting: Obstetrics & Gynecology

## 2023-11-04 ENCOUNTER — Other Ambulatory Visit (HOSPITAL_BASED_OUTPATIENT_CLINIC_OR_DEPARTMENT_OTHER): Payer: Self-pay | Admitting: Obstetrics & Gynecology

## 2023-11-04 DIAGNOSIS — Z30013 Encounter for initial prescription of injectable contraceptive: Secondary | ICD-10-CM

## 2023-11-14 ENCOUNTER — Ambulatory Visit
Admission: RE | Admit: 2023-11-14 | Discharge: 2023-11-14 | Disposition: A | Discharge status: Home / Self Care | Source: Ambulatory Visit | Attending: Obstetrics & Gynecology | Admitting: Obstetrics & Gynecology

## 2023-11-14 ENCOUNTER — Encounter (HOSPITAL_BASED_OUTPATIENT_CLINIC_OR_DEPARTMENT_OTHER): Payer: Self-pay | Admitting: Obstetrics & Gynecology

## 2023-11-14 ENCOUNTER — Ambulatory Visit (HOSPITAL_BASED_OUTPATIENT_CLINIC_OR_DEPARTMENT_OTHER): Payer: 59 | Admitting: Obstetrics & Gynecology

## 2023-11-14 VITALS — BP 110/72 | HR 84 | Ht 67.0 in | Wt 167.0 lb

## 2023-11-14 DIAGNOSIS — B009 Herpesviral infection, unspecified: Secondary | ICD-10-CM | POA: Diagnosis not present

## 2023-11-14 DIAGNOSIS — Z30013 Encounter for initial prescription of injectable contraceptive: Secondary | ICD-10-CM

## 2023-11-14 DIAGNOSIS — Z86718 Personal history of other venous thrombosis and embolism: Secondary | ICD-10-CM | POA: Diagnosis not present

## 2023-11-14 DIAGNOSIS — Z9889 Other specified postprocedural states: Secondary | ICD-10-CM | POA: Diagnosis not present

## 2023-11-14 DIAGNOSIS — Z01419 Encounter for gynecological examination (general) (routine) without abnormal findings: Secondary | ICD-10-CM

## 2023-11-14 DIAGNOSIS — Z1231 Encounter for screening mammogram for malignant neoplasm of breast: Secondary | ICD-10-CM

## 2023-11-14 NOTE — Progress Notes (Signed)
   ANNUAL EXAM Patient name: Brittney Franco MRN [id number]  Date of birth: 1980/08/07 Chief Complaint:   AEX  History of Present Illness:   Brittney Franco is a 43 y.o. G48P4 African-American female being seen today for a routine annual exam.  Had robotic cholecystectomy 11/05/2022.  Pain improved very quickly after surgery.    Uses Depo Provera  for contraception and cycle control.  Last injection was 08/09/2023.  Last intercourse was March so she will go ahead and update the Depo Provera .   Had a grandchild born 12/14.    No LMP recorded. Patient has had an injection.      Last pap  10/11/2022. Results were: NILM w/ HRHPV negative. H/O abnormal pap: yes h/o LEEP 2021 Last mammogram: 11/14/2023. Results were: still pending. Family h/o breast cancer: no Last colonoscopy:  will plan around age 37.  Her mother had colon cancer in her early 14's.     Review of Systems:   Pertinent items are noted in HPI  Denies any new GI issues or urinary changes Pertinent History Reviewed:  Reviewed past medical,surgical, social and family history.   Reviewed problem list, medications and allergies. Physical Assessment:   Vitals:   11/14/23 1438 11/14/23 1508  BP: (!) 136/96 110/72  Pulse: 84   Weight: 167 lb (75.8 kg)   Height: 5\' 7"  (1.702 m)   Body mass index is 26.16 kg/m.        Physical Examination:   General appearance - well appearing, and in no distress  Mental status - alert, oriented to person, place, and time  Psych:  She has a normal mood and affect  Skin - warm and dry, normal color, no suspicious lesions noted  Chest - effort normal, all lung fields clear to auscultation bilaterally  Heart - normal rate and regular rhythm  Neck:  midline trachea, no thyromegaly or nodules  Breasts - breasts appear normal, no suspicious masses, no skin or nipple changes or  axillary nodes  Abdomen - soft, nontender, nondistended, no masses or organomegaly  Pelvic - VULVA: normal appearing vulva  with no masses, tenderness or lesions   VAGINA: normal appearing vagina with normal color and discharge, no lesions   CERVIX: normal appearing cervix without discharge or lesions, no CMT  Thin prep pap is not done  UTERUS: uterus is felt to be normal size, shape, consistency and nontender   ADNEXA: No adnexal masses or tenderness noted.  Rectal - normal rectal, good sphincter tone, no masses felt.  Extremities:  No swelling or varicosities noted    1. Well woman exam with routine gynecological exam (Primary) - Pap smear neg with neg HR HPV 10/11/2022 and 09/2021.  Guidelines reviewed.  Not indicated today. - Mammogram done today - Colonoscopy guidelines reviewed.  Will plan around age 48 - lab work done 09/2021.  Will repeat next year if does not have PCP at that time.  - vaccines reviewed/updated.  She is aware I do not have Tdap documentation and to update if has exposure  2. Encounter for initial prescription of injectable contraceptive - RF for Depo Provera  done 11/06/2023  3. History of loop electrical excision procedure (LEEP)  4. History of DVT (deep vein thrombosis)  5. HSV (herpes simplex virus) infection   Meds: No orders of the defined types were placed in this encounter.   Follow-up: Return in about 1 year (around 11/13/2024).  Lillian Rein, MD 11/14/2023 3:17 PM

## 2024-05-05 ENCOUNTER — Ambulatory Visit: Admission: EM | Admit: 2024-05-05 | Discharge: 2024-05-05 | Discharge status: Against Medical Advice

## 2024-05-05 ENCOUNTER — Other Ambulatory Visit: Payer: Self-pay

## 2024-05-05 ENCOUNTER — Encounter: Payer: Self-pay | Admitting: Emergency Medicine

## 2024-05-05 NOTE — ED Notes (Signed)
 Pt not in room and heard exiting
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
## 2024-11-19 ENCOUNTER — Ambulatory Visit (HOSPITAL_BASED_OUTPATIENT_CLINIC_OR_DEPARTMENT_OTHER): Admitting: Obstetrics & Gynecology
